# Patient Record
Sex: Male | Born: 1990 | Hispanic: No | Marital: Single | Smoking: Never smoker
Health system: Southern US, Community
[De-identification: ages and names within clinical notes are randomized; demographics above are authoritative.]

## PROBLEM LIST (undated history)

## (undated) DIAGNOSIS — F338 Other recurrent depressive disorders: Secondary | ICD-10-CM

---

## 2012-04-05 ENCOUNTER — Emergency Department: Payer: Self-pay | Admitting: Emergency Medicine

## 2012-04-24 ENCOUNTER — Emergency Department: Payer: Self-pay | Admitting: Emergency Medicine

## 2012-04-24 LAB — URINALYSIS, COMPLETE
Bacteria: NONE SEEN
Bilirubin,UR: NEGATIVE
Blood: NEGATIVE
Glucose,UR: NEGATIVE mg/dL (ref 0–75)
Protein: NEGATIVE
RBC,UR: NONE SEEN /HPF (ref 0–5)
Specific Gravity: 1.008 (ref 1.003–1.030)
Squamous Epithelial: NONE SEEN
WBC UR: <1 /HPF (ref 0–5)

## 2012-04-24 LAB — DRUG SCREEN, URINE
Barbiturates, Ur Screen: NEGATIVE (ref ?–200)
Cannabinoid 50 Ng, Ur ~~LOC~~: NEGATIVE (ref ?–50)
Cocaine Metabolite,Ur ~~LOC~~: NEGATIVE (ref ?–300)
MDMA (Ecstasy)Ur Screen: NEGATIVE (ref ?–500)
Opiate, Ur Screen: NEGATIVE (ref ?–300)
Phencyclidine (PCP) Ur S: NEGATIVE (ref ?–25)

## 2012-04-24 LAB — CBC
HCT: 47 % (ref 40.0–52.0)
Platelet: 334 10*3/uL (ref 150–440)
RBC: 5.21 10*6/uL (ref 4.40–5.90)
RDW: 12.4 % (ref 11.5–14.5)
WBC: 14.9 10*3/uL — ABNORMAL HIGH (ref 3.8–10.6)

## 2012-04-24 LAB — ETHANOL: Ethanol: <3 mg/dL

## 2012-04-24 LAB — COMPREHENSIVE METABOLIC PANEL
Alkaline Phosphatase: 83 U/L (ref 50–136)
Anion Gap: 11 (ref 7–16)
Creatinine: 1.16 mg/dL (ref 0.60–1.30)
EGFR (African American): >60
Glucose: 113 mg/dL — ABNORMAL HIGH (ref 65–99)
Osmolality: 274 (ref 275–301)
SGPT (ALT): 23 U/L
Sodium: 136 mmol/L (ref 136–145)

## 2012-04-24 LAB — CK: CK, Total: 162 U/L (ref 35–232)

## 2012-04-24 LAB — TSH: Thyroid Stimulating Horm: 4.05 u[IU]/mL

## 2012-12-07 ENCOUNTER — Ambulatory Visit: Payer: Self-pay | Admitting: Psychiatry

## 2012-12-07 LAB — COMPREHENSIVE METABOLIC PANEL
Alkaline Phosphatase: 69 U/L (ref 50–136)
Anion Gap: 3 — ABNORMAL LOW (ref 7–16)
BUN: 15 mg/dL (ref 7–18)
Bilirubin,Total: 0.5 mg/dL (ref 0.2–1.0)
Calcium, Total: 9.1 mg/dL (ref 8.5–10.1)
Chloride: 108 mmol/L — ABNORMAL HIGH (ref 98–107)
EGFR (African American): >60
EGFR (Non-African Amer.): >60
Osmolality: 280 (ref 275–301)
Potassium: 4.5 mmol/L (ref 3.5–5.1)
SGOT(AST): 28 U/L (ref 15–37)
Sodium: 140 mmol/L (ref 136–145)
Total Protein: 7.9 g/dL (ref 6.4–8.2)

## 2012-12-07 LAB — LIPID PANEL
HDL Cholesterol: 56 mg/dL (ref 40–60)
Ldl Cholesterol, Calc: 119 mg/dL — ABNORMAL HIGH (ref 0–100)
VLDL Cholesterol, Calc: 17 mg/dL (ref 5–40)

## 2012-12-07 LAB — TSH: Thyroid Stimulating Horm: 1.97 u[IU]/mL

## 2012-12-16 ENCOUNTER — Ambulatory Visit: Payer: Self-pay | Admitting: Psychiatry
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
## 2018-11-25 ENCOUNTER — Encounter: Payer: Self-pay | Admitting: Emergency Medicine

## 2018-11-25 ENCOUNTER — Other Ambulatory Visit: Payer: Self-pay

## 2018-11-25 ENCOUNTER — Emergency Department
Admission: EM | Admit: 2018-11-25 | Discharge: 2018-11-25 | Disposition: A | Discharge status: Other Acute Inpatient Hospital | Payer: Self-pay | Attending: Emergency Medicine | Admitting: Emergency Medicine

## 2018-11-25 ENCOUNTER — Inpatient Hospital Stay
Admission: AD | Admit: 2018-11-25 | Discharge: 2018-12-02 | DRG: 885 | Disposition: A | Discharge status: Home / Self Care | Payer: No Typology Code available for payment source | Source: Intra-hospital | Attending: Psychiatry | Admitting: Psychiatry

## 2018-11-25 DIAGNOSIS — Y929 Unspecified place or not applicable: Secondary | ICD-10-CM | POA: Insufficient documentation

## 2018-11-25 DIAGNOSIS — E559 Vitamin D deficiency, unspecified: Secondary | ICD-10-CM | POA: Diagnosis present

## 2018-11-25 DIAGNOSIS — F419 Anxiety disorder, unspecified: Secondary | ICD-10-CM | POA: Diagnosis present

## 2018-11-25 DIAGNOSIS — F329 Major depressive disorder, single episode, unspecified: Secondary | ICD-10-CM | POA: Diagnosis present

## 2018-11-25 DIAGNOSIS — F39 Unspecified mood [affective] disorder: Secondary | ICD-10-CM | POA: Diagnosis present

## 2018-11-25 DIAGNOSIS — R030 Elevated blood-pressure reading, without diagnosis of hypertension: Secondary | ICD-10-CM | POA: Diagnosis not present

## 2018-11-25 DIAGNOSIS — F203 Undifferentiated schizophrenia: Secondary | ICD-10-CM

## 2018-11-25 DIAGNOSIS — F3132 Bipolar disorder, current episode depressed, moderate: Secondary | ICD-10-CM

## 2018-11-25 DIAGNOSIS — Z915 Personal history of self-harm: Secondary | ICD-10-CM | POA: Diagnosis not present

## 2018-11-25 DIAGNOSIS — F209 Schizophrenia, unspecified: Secondary | ICD-10-CM | POA: Diagnosis present

## 2018-11-25 DIAGNOSIS — R946 Abnormal results of thyroid function studies: Secondary | ICD-10-CM | POA: Diagnosis present

## 2018-11-25 DIAGNOSIS — Z91199 Patient's noncompliance with other medical treatment and regimen due to unspecified reason: Secondary | ICD-10-CM

## 2018-11-25 DIAGNOSIS — IMO0002 Reserved for concepts with insufficient information to code with codable children: Secondary | ICD-10-CM

## 2018-11-25 DIAGNOSIS — Z7289 Other problems related to lifestyle: Secondary | ICD-10-CM

## 2018-11-25 DIAGNOSIS — Z888 Allergy status to other drugs, medicaments and biological substances status: Secondary | ICD-10-CM

## 2018-11-25 DIAGNOSIS — X838XXA Intentional self-harm by other specified means, initial encounter: Secondary | ICD-10-CM | POA: Insufficient documentation

## 2018-11-25 DIAGNOSIS — Z9119 Patient's noncompliance with other medical treatment and regimen: Secondary | ICD-10-CM

## 2018-11-25 DIAGNOSIS — Y999 Unspecified external cause status: Secondary | ICD-10-CM | POA: Insufficient documentation

## 2018-11-25 DIAGNOSIS — Z9114 Patient's other noncompliance with medication regimen: Secondary | ICD-10-CM | POA: Diagnosis not present

## 2018-11-25 DIAGNOSIS — Y939 Activity, unspecified: Secondary | ICD-10-CM | POA: Insufficient documentation

## 2018-11-25 HISTORY — DX: Other recurrent depressive disorders: F33.8

## 2018-11-25 LAB — COMPREHENSIVE METABOLIC PANEL
ALBUMIN: 4.6 g/dL (ref 3.5–5.0)
ALK PHOS: 53 U/L (ref 38–126)
ALT: 15 U/L (ref 0–44)
AST: 23 U/L (ref 15–41)
Anion gap: 9 (ref 5–15)
BUN: 16 mg/dL (ref 6–20)
CALCIUM: 9.6 mg/dL (ref 8.9–10.3)
CHLORIDE: 102 mmol/L (ref 98–111)
CO2: 27 mmol/L (ref 22–32)
CREATININE: 0.89 mg/dL (ref 0.61–1.24)
GFR calc Af Amer: >60 mL/min (ref >60)
GFR calc non Af Amer: >60 mL/min (ref >60)
GLUCOSE: 109 mg/dL — AB (ref 70–99)
Potassium: 4.2 mmol/L (ref 3.5–5.1)
SODIUM: 138 mmol/L (ref 135–145)
Total Bilirubin: 0.7 mg/dL (ref 0.3–1.2)
Total Protein: 8.6 g/dL — ABNORMAL HIGH (ref 6.5–8.1)

## 2018-11-25 LAB — CBC
HEMATOCRIT: 45.3 % (ref 39.0–52.0)
HEMOGLOBIN: 15.1 g/dL (ref 13.0–17.0)
MCH: 29.3 pg (ref 26.0–34.0)
MCHC: 33.3 g/dL (ref 30.0–36.0)
MCV: 87.8 fL (ref 80.0–100.0)
Platelets: 293 10*3/uL (ref 150–400)
RBC: 5.16 MIL/uL (ref 4.22–5.81)
RDW: 11.9 % (ref 11.5–15.5)
WBC: 10.8 10*3/uL — AB (ref 4.0–10.5)
nRBC: 0 % (ref 0.0–0.2)

## 2018-11-25 LAB — ACETAMINOPHEN LEVEL

## 2018-11-25 LAB — ETHANOL: Alcohol, Ethyl (B): <10 mg/dL (ref <10)

## 2018-11-25 LAB — SALICYLATE LEVEL: Salicylate Lvl: <7 mg/dL (ref 2.8–30.0)

## 2018-11-25 MED ORDER — HYDROXYZINE HCL 25 MG PO TABS
25.0000 mg | ORAL_TABLET | Freq: Three times a day (TID) | ORAL | Status: DC | PRN
  Administered 2018-11-26: 25 mg via ORAL
  Filled 2018-11-25: qty 1

## 2018-11-25 MED ORDER — ACETAMINOPHEN 325 MG PO TABS: 650.0000 mg | ORAL_TABLET | Freq: Four times a day (QID) | ORAL | Status: DC | PRN

## 2018-11-25 MED ORDER — MAGNESIUM HYDROXIDE 400 MG/5ML PO SUSP: 30.0000 mL | Freq: Every day | ORAL | Status: DC | PRN

## 2018-11-25 MED ORDER — TRAZODONE HCL 100 MG PO TABS
100.0000 mg | ORAL_TABLET | Freq: Every evening | ORAL | Status: DC | PRN
  Administered 2018-11-25 – 2018-11-30 (×5): 100 mg via ORAL
  Filled 2018-11-25 (×6): qty 1

## 2018-11-25 MED ORDER — OLANZAPINE 10 MG PO TABS
10.0000 mg | ORAL_TABLET | Freq: Every day | ORAL | Status: DC
  Administered 2018-11-25 – 2018-12-01 (×7): 10 mg via ORAL
  Filled 2018-11-25 (×7): qty 1

## 2018-11-25 MED ORDER — DOUBLE ANTIBIOTIC 500-10000 UNIT/GM EX OINT
TOPICAL_OINTMENT | Freq: Two times a day (BID) | CUTANEOUS | Status: AC
  Administered 2018-11-25: 22:00:00 via TOPICAL
  Administered 2018-11-26 (×2): 1 via TOPICAL
  Filled 2018-11-25: qty 28.4

## 2018-11-25 MED ORDER — LORAZEPAM 2 MG/ML IJ SOLN: 0.5000 mg | Freq: Four times a day (QID) | INTRAMUSCULAR | Status: DC | PRN

## 2018-11-25 MED ORDER — DOUBLE ANTIBIOTIC 500-10000 UNIT/GM EX OINT
TOPICAL_OINTMENT | Freq: Two times a day (BID) | CUTANEOUS | Status: DC
  Administered 2018-11-25: 16:00:00 via TOPICAL
  Filled 2018-11-25: qty 28.4

## 2018-11-25 MED ORDER — OLANZAPINE 10 MG PO TABS: 10.0000 mg | ORAL_TABLET | Freq: Every day | ORAL | Status: DC

## 2018-11-25 MED ORDER — ALUM & MAG HYDROXIDE-SIMETH 200-200-20 MG/5ML PO SUSP: 30.0000 mL | ORAL | Status: DC | PRN

## 2018-11-25 NOTE — Tx Team (Signed)
Initial Treatment Plan 11/25/2018 5:42 PM [name] [id number]    PATIENT STRESSORS: Financial difficulties Medication change or noncompliance   PATIENT STRENGTHS: Ability for insight Average or above average intelligence Capable of independent living Communication skills   PATIENT IDENTIFIED PROBLEMS: DEPRESSION 11/25/18  Suicidal  11/25/18                   DISCHARGE CRITERIA:  Ability to meet basic life and health needs Improved stabilization in mood, thinking, and/or behavior  PRELIMINARY DISCHARGE PLAN: Outpatient therapy Return to previous living arrangement  PATIENT/FAMILY INVOLVEMENT: This treatment plan has been presented to and reviewed with the patient, [name], and/or family member,  The patient and family have been given the opportunity to ask questions and make suggestions.  Crist InfanteGwen A Umair Rosiles, RN 11/25/2018, 5:42 PM

## 2018-11-25 NOTE — ED Notes (Signed)
Nurse received report from Roger B. In the quad, patient transferred to room 4 in the BHU, patient is quiet and cooperative at this time.
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
## 2018-11-25 NOTE — BH Assessment (Signed)
Patient is to be [facility] BM[facility] by Dr. Toni Amendlapacs.  Attending Physician will be Dr.Pucilowska.   Patient has been assigned to room 320, by [facility] Charge Nurse Gwen.   Intake Paper Work has been signed and placed on patient chart.  ER staff is aware of the admission:  Nitchaa, ER Secretary    Dr. Lenard LancePaduchowski, ER MD   Toniann FailWendy, Patient's Nurse   Ethelene BrownsAnthony, Patient Access.

## 2018-11-25 NOTE — ED Notes (Signed)
Patient with discharge/readmit orders to go to BMU, Patient voices understanding.

## 2018-11-25 NOTE — ED Notes (Signed)
Patient resting quietly in room. No noted distress or abnormal behaviors noted. Will continue 15 minute checks and observation by security for safety. 
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
## 2018-11-25 NOTE — BH Assessment (Signed)
Assessment Note  [name] is an 27 y.o. male. [name] arrived to the ED by law enforcement under IVC.  He reports "I was taking off my glasses, and I accidentally scratched my neck. I came home and I saw the scar. My mom wanted me to come to the hospital and have it checked out.  He states that Dr. Octavia Heir had recommended some vitamin supplements and he wondered if there were other supplements that he could take. He states that the vitamins are for mood stabilization. He reports that he has seasonal depression.  He states that he does not go out much, easily frustrated, less goal oriented, and less purpose.  He denied symptoms of anxiety.  He denied having auditory or visual hallucinations.  He denied suicidal ideation or intent.  He denied homicidal ideation or intent.  He reports that he is going to be alone in the house soon.  He denied use of alcohol or drugs for 2-3 months.   IVC paperwork reports, "Law enforcement was contracted and responded to the listed address regarding a person with mental issues that had hurt himself. [name] met with the respondent. Who is being treated for mental health issues and states that he had stabbed himself in the neck because he was upset with his work hours. The respondent stated the pain of hurting himself makes him feel better. Because of this latest event of hurting himself the respondent was placed in custody and taken to the hospital for treatment of his injury and it is felt a mental health evaluation should be completed as it is feared the respondent will continue to harm himself or possible others that may try to stop him.  Diagnosis: Depression  Past Medical History:  Past Medical History:  Diagnosis Date  . Seasonal affective disorder ([place])     History reviewed. No pertinent surgical history.  Family History: History reviewed. No pertinent family history.  Social History:  reports that he has never smoked. He has never used smokeless  tobacco. He reports that he does not drink alcohol or use drugs.  Additional Social History:  Alcohol / Drug Use History of alcohol / drug use?: (Reports no substance use for 2-3 months)  CIWA: CIWA-Ar BP: (!) 142/79 Pulse Rate: 90 COWS:    Allergies:  Allergies  Allergen Reactions  . Haldol [Haloperidol Lactate] Other (See Comments)    Involuntary movements  . Other     Pt says he has sensitive skin-gets bacterial infections easily; has to be careful with laundry detergent and dishwasher soap    Home Medications:  (Not in a hospital admission)  OB/GYN Status:  No LMP for male patient.  General Assessment Data Location of Assessment: [facility] ED TTS Assessment: In system Is this a Tele or Face-to-Face Assessment?: Face-to-Face Is this an Initial Assessment or a Re-assessment for this encounter?: Initial Assessment Patient Accompanied by:: N/A Language Other than English: No Living Arrangements: Other (Comment)(Private residence) Marital status: Single Pregnancy Status: No Living Arrangements: Parent Can pt return to current living arrangement?: Yes Admission Status: Involuntary Petitioner: Police Is patient capable of signing voluntary admission?: No Referral Source: Self/Family/Friend Insurance type: Unsure  Medical Screening Exam ([place]) Medical Exam completed: Yes  Crisis Care Plan Living Arrangements: Parent Legal Guardian: Other:(Self) Name of Psychiatrist: Dr. Octavia Heir Name of Therapist: [facility]  Education Status Is patient currently in school?: No Is the patient employed, unemployed or receiving disability?: Employed(Part time)  Risk to self with the past 6 months  Suicidal Ideation: No(patient denied) Has patient been a risk to self within the past 6 months prior to admission? : No Suicidal Intent: No Has patient had any suicidal intent within the past 6 months prior to admission? : No Is patient at risk for suicide?: No Suicidal Plan?: No Has  patient had any suicidal plan within the past 6 months prior to admission? : No Access to Means: No What has been your use of drugs/alcohol within the last 12 months?: Has not used drugs in the past 2-3 months Previous Attempts/Gestures: No How many times?: 0 Other Self Harm Risks: denied Triggers for Past Attempts: None known Intentional Self Injurious Behavior: None Family Suicide History: No Recent stressful life event(s): Other (Comment)(family pressure) Persecutory voices/beliefs?: No Depression: Yes Depression Symptoms: Despondent Substance abuse history and/or treatment for substance abuse?: Yes Suicide prevention information given to non-admitted patients: Not applicable  Risk to Others within the past 6 months Homicidal Ideation: No Does patient have any lifetime risk of violence toward others beyond the six months prior to admission? : No Thoughts of Harm to Others: No Current Homicidal Intent: No Current Homicidal Plan: No Access to Homicidal Means: No Identified Victim: No one identified History of harm to others?: No Assessment of Violence: None Noted Violent Behavior Description: denied Does patient have access to weapons?: No Criminal Charges Pending?: No Does patient have a court date: No Is patient on probation?: No  Psychosis Hallucinations: None noted Delusions: None noted  Mental Status Report Appearance/Hygiene: In scrubs Eye Contact: Poor(Staring straight ahead) Motor Activity: Unremarkable Speech: Slow, Soft Level of Consciousness: Alert Mood: Depressed Affect: Flat Anxiety Level: None Thought Processes: Tangential Judgement: Partial Orientation: Appropriate for developmental age Obsessive Compulsive Thoughts/Behaviors: None  Cognitive Functioning Concentration: Fair Memory: Recent Intact Is patient IDD: No Insight: Fair Impulse Control: Fair Appetite: Fair Have you had any weight changes? : No Change Sleep: Increased Vegetative  Symptoms: None  ADLScreening [facility] Assessment Services) Patient's cognitive ability adequate to safely complete daily activities?: Yes Patient able to express need for assistance with ADLs?: Yes Independently performs ADLs?: Yes (appropriate for developmental age)  Prior Inpatient Therapy Prior Inpatient Therapy: No  Prior Outpatient Therapy Prior Outpatient Therapy: Yes Prior Therapy Dates: Current Prior Therapy Facilty/Provider(s): [facility] Reason for Treatment: Depression, Substance abuse Does patient have an ACCT team?: No Does patient have Intensive In-House Services?  : No Does patient have [facility] services? : No Does patient have P4CC services?: No  ADL Screening (condition at time of admission) Patient's cognitive ability adequate to safely complete daily activities?: Yes Is the patient deaf or have difficulty hearing?: No Does the patient have difficulty seeing, even when wearing glasses/contacts?: No Does the patient have difficulty concentrating, remembering, or making decisions?: No Patient able to express need for assistance with ADLs?: Yes Does the patient have difficulty dressing or bathing?: No Independently performs ADLs?: Yes (appropriate for developmental age) Does the patient have difficulty walking or climbing stairs?: No Weakness of Legs: None Weakness of Arms/Hands: None  Home Assistive Devices/Equipment Home Assistive Devices/Equipment: None    Abuse/Neglect Assessment (Assessment to be complete while patient is alone) Abuse/Neglect Assessment Can Be Completed: (denied a history of abuse)     Advance Directives (For Healthcare) Does Patient Have a Medical Advance Directive?: No Would patient like information on creating a medical advance directive?: No - Patient declined          Disposition:  Disposition Initial Assessment Completed for this Encounter: Yes  On Site Evaluation  by:   Reviewed with Physician:    Elmer Bales 11/25/2018 3:45  AM

## 2018-11-25 NOTE — Consult Note (Signed)
[facility] Face-to-Face Psychiatry Consult   Reason for Consult: Consult for this 27 year old man brought into the hospital because of a superficial cut to his neck Referring Physician: Paduchowski Patient Identification: [name] MRN:  [id number] Principal Diagnosis: Schizophrenia (HCC) Diagnosis:  Principal Problem:   Schizophrenia (HCC) Active Problems:   Noncompliance   Self-inflicted injury   Total Time spent with patient: 1 hour  Subjective:   [name] is a 27 y.o. male patient admitted with "they did not give me enough hours so I was frustrated".  HPI: Patient seen chart reviewed.  Also spoke with the patient's mother by telephone.  75 year old man with a history of schizophrenia.  Patient was on his way home from work apparently yesterday and had found out that he was only given 1 day of work next week.  He was frustrated that he is not getting more hours.  He says he cut himself on the neck.  He had changed his story at one point to say that it was unintentional but admits now that he used the sharp end of a pair of eyeglasses to cut himself on the neck.  Extremely superficial cut does not look like there was any intention to kill himself but he did feel frustrated and feel like hurting himself.  Patient admits his mood is been anxious down negative and worried for quite a while now.  He is somewhat vague and disorganized in his thinking and has a hard time putting an exact date on it.  Sleep sounds like it is erratic depending on his work schedule.  Has almost no activity outside of working when he is staying at home with his family.  Patient denies any hallucinations but does seem scattered and disorganized in his thinking.  Mother reports that the patient has been showing clear psychotic symptoms recently.  Reports that he seems disorganized withdrawn and is not taking care of himself.  He has not been following up with outpatient psychiatric care probably for years.  He  denies any recent alcohol or drug use.  Social history: Lives at home with his parents.  Patient actually was in college and doing very well when he developed a psychotic disorder.  Family and patient still mourning his loss of opportunity.  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  Medical history: This cut on his neck is extremely superficial scratch but there is no harm in putting some antibiotic ointment on it.  Substance abuse history: Denies alcohol or drug use no significant past issues as far as I can see.  Past Psychiatric History: Patient has a history of schizophrenia.  He has had prior hospitalizations and been treated with a variety of medicines including both antidepressants and antipsychotics.  He did well by family report when he was taking Zyprexa and managed to stay stable for about a year but then again became noncompliant.  He has repeatedly been noncompliant with long-term treatment.  Does not have a history of violence.  Some history of self injury in the past.  Risk to Self: Suicidal Ideation: No(patient denied) Suicidal Intent: No Is patient at risk for suicide?: No Suicidal Plan?: No Access to Means: No What has been your use of drugs/alcohol within the last 12 months?: Has not used drugs in the past 2-3 months How many times?: 0 Other Self Harm Risks: denied Triggers for Past Attempts: None known Intentional Self Injurious Behavior: None Risk to  Others: Homicidal Ideation: No Thoughts of Harm to Others: No Current Homicidal Intent: No Current Homicidal Plan: No Access to Homicidal Means: No Identified Victim: No one identified History of harm to others?: No Assessment of Violence: None Noted Violent Behavior Description: denied Does patient have access to weapons?: No Criminal Charges Pending?: No Does patient have a court date: No Prior Inpatient Therapy: Prior Inpatient Therapy:  No Prior Outpatient Therapy: Prior Outpatient Therapy: Yes Prior Therapy Dates: Current Prior Therapy Facilty/Provider(s): [facility] Reason for Treatment: Depression, Substance abuse Does patient have an ACCT team?: No Does patient have Intensive In-House Services?  : No Does patient have [facility] services? : No Does patient have P4CC services?: No  Past Medical History:  Past Medical History:  Diagnosis Date  . Seasonal affective disorder (HCC)    History reviewed. No pertinent surgical history. Family History: History reviewed. No pertinent family history. Family Psychiatric  History: None reported Social History:  Social History   Substance and Sexual Activity  Alcohol Use Never  . Frequency: Never     Social History   Substance and Sexual Activity  Drug Use Never    Social History   Socioeconomic History  . Marital status: Single    Spouse name: Not on file  . Number of children: Not on file  . Years of education: Not on file  . Highest education level: Not on file  Occupational History  . Not on file  Social Needs  . Financial resource strain: Not on file  . Food insecurity:    Worry: Not on file    Inability: Not on file  . Transportation needs:    Medical: Not on file    Non-medical: Not on file  Tobacco Use  . Smoking status: Never Smoker  . Smokeless tobacco: Never Used  Substance and Sexual Activity  . Alcohol use: Never    Frequency: Never  . Drug use: Never  . Sexual activity: Not on file  Lifestyle  . Physical activity:    Days per week: Not on file    Minutes per session: Not on file  . Stress: Not on file  Relationships  . Social connections:    Talks on phone: Not on file    Gets together: Not on file    Attends religious service: Not on file    Active member of club or organization: Not on file    Attends meetings of clubs or organizations: Not on file    Relationship status: Not on file  Other Topics Concern  . Not on file  Social  History Narrative  . Not on file   Additional Social History:    Allergies:   Allergies  Allergen Reactions  . Haldol [Haloperidol Lactate] Other (See Comments)    Involuntary movements  . Other     Pt says he has sensitive skin-gets bacterial infections easily; has to be careful with laundry detergent and dishwasher soap    Labs:  Results for orders placed or performed during the hospital encounter of 11/25/18 (from the past 48 hour(s))  Comprehensive metabolic panel     Status: Abnormal   Collection Time: 11/25/18 12:50 AM  Result Value Ref Range   Sodium 138 135 - 145 mmol/L   Potassium 4.2 3.5 - 5.1 mmol/L   Chloride 102 98 - 111 mmol/L   CO2 27 22 - 32 mmol/L   Glucose, Bld 109 (H) 70 - 99 mg/dL   BUN 16 6 - 20 mg/dL  Creatinine, Ser 0.89 0.61 - 1.24 mg/dL   Calcium 9.6 8.9 - 40.910.3 mg/dL   Total Protein 8.6 (H) 6.5 - 8.1 g/dL   Albumin 4.6 3.5 - 5.0 g/dL   AST 23 15 - 41 U/L   ALT 15 0 - 44 U/L   Alkaline Phosphatase 53 38 - 126 U/L   Total Bilirubin 0.7 0.3 - 1.2 mg/dL   GFR calc non Af Amer >60 >60 mL/min   GFR calc Af Amer >60 >60 mL/min   Anion gap 9 5 - 15    Comment: Performed at [facility], [address] [place], [place] [address]  Ethanol     Status: None   Collection Time: 11/25/18 12:50 AM  Result Value Ref Range   Alcohol, Ethyl (B) <10 <10 mg/dL    Comment: (NOTE) Lowest detectable limit for serum alcohol is 10 mg/dL. For medical purposes only. Performed at [facility], [address] [place], [place] [address]   cbc     Status: Abnormal   Collection Time: 11/25/18 12:50 AM  Result Value Ref Range   WBC 10.8 (H) 4.0 - 10.5 K/uL   RBC 5.16 4.22 - 5.81 MIL/uL   Hemoglobin 15.1 13.0 - 17.0 g/dL   HCT 56.245.3 13.039.0 - 86.552.0 %   MCV 87.8 80.0 - 100.0 fL   MCH 29.3 26.0 - 34.0 pg   MCHC 33.3 30.0 - 36.0 g/dL   RDW 78.411.9 69.611.5 - 29.515.5 %   Platelets 293 150 - 400 K/uL   nRBC 0.0 0.0 - 0.2 %    Comment: Performed at [facility], [address] [place], [place] [address]  Acetaminophen level     Status: Abnormal   Collection Time: 11/25/18 12:50 AM  Result Value Ref Range   Acetaminophen (Tylenol), Serum <10 (L) 10 - 30 ug/mL    Comment: (NOTE) Therapeutic concentrations vary significantly. A range of 10-30 ug/mL  may be an effective concentration for many patients. However, some  are best treated at concentrations outside of this range. Acetaminophen concentrations >150 ug/mL at 4 hours after ingestion  and >50 ug/mL at 12 hours after ingestion are often associated with  toxic reactions. Performed at [facility], [address] [place], [place] [address]   Salicylate level     Status: None   Collection Time: 11/25/18 12:50 AM  Result Value Ref Range   Salicylate Lvl <7.0 2.8 - 30.0 mg/dL    Comment: Performed at [facility], [address] [place], [place] [address]    Current Facility-Administered Medications  Medication Dose Route Frequency Provider Last Rate Last Dose  . LORazepam (ATIVAN) injection 0.5 mg  0.5 mg Intramuscular Q6H PRN Irean HongSung, Jade J, MD      . OLANZapine [facility](ZYPREXA) tablet 10 mg  10 mg Oral QHS Heidi Maclin, Jackquline DenmarkJohn T, MD      . polymixin-bacitracin (POLYSPORIN) ointment   Topical BID Jash Wahlen, Jackquline DenmarkJohn T, MD       No current outpatient medications on file.    Musculoskeletal: Strength & Muscle Tone: within normal limits Gait & Station: normal Patient leans: N/A  Psychiatric Specialty Exam: Physical Exam  Nursing note and vitals reviewed. Constitutional: He appears well-developed and well-nourished.  HENT:  Head: Normocephalic and atraumatic.  Eyes: Pupils are equal, round, and reactive to light. Conjunctivae are normal.  Neck: Normal range of motion.  Cardiovascular: Regular rhythm and normal heart sounds.  Respiratory: Effort normal. No respiratory distress.  GI: Soft.  Musculoskeletal: Normal range of motion.  Neurological: He is alert.  Skin:  Skin is warm and dry.  Psychiatric: His affect is blunt. His speech is delayed and tangential. He is slowed and withdrawn. Thought content is paranoid. Cognition and memory are impaired. He expresses impulsivity and inappropriate judgment. He expresses no homicidal and no suicidal ideation. He is inattentive.    Review of Systems  Constitutional: Negative.   HENT: Negative.   Eyes: Negative.   Respiratory: Negative.   Cardiovascular: Negative.   Gastrointestinal: Negative.   Musculoskeletal: Negative.   Skin: Negative.   Neurological: Negative.   Psychiatric/Behavioral: Positive for depression and memory loss. Negative for hallucinations, substance abuse and suicidal ideas. The patient is nervous/anxious and has insomnia.     Blood pressure (!) 142/79, pulse 90, temperature 99.1 F (37.3 C), temperature source Oral, resp. rate 18, height 5\' 11"  (1.803 m), weight 72.6 kg, SpO2 100 %.Body mass index is 22.32 kg/m.  General Appearance: Fairly Groomed  Eye Contact:  Minimal  Speech:  Slow  Volume:  Decreased  Mood:  Dysphoric  Affect:  Constricted  Thought Process:  Disorganized  Orientation:  Full (Time, Place, and Person)  Thought Content:  Illogical, Rumination and Tangential  Suicidal Thoughts:  No  Homicidal Thoughts:  No  Memory:  Immediate;   Fair Recent;   Poor Remote;   Fair  Judgement:  Impaired  Insight:  Shallow  Psychomotor Activity:  Decreased  Concentration:  Concentration: Poor  Recall:  Poor  Fund of Knowledge:  Fair  Language:  Fair  Akathisia:  No  Handed:  Right  AIMS (if indicated):     Assets:  Desire for Improvement Housing Physical Health Resilience  ADL's:  Impaired  Cognition:  Impaired,  Mild  Sleep:        Treatment Plan Summary: Daily contact with patient to assess and evaluate symptoms and progress in treatment, Medication management and Plan Patient with schizophrenia presents after superficially cutting himself on the neck.  Patient  presents with disorganized thinking poor functioning out the hospital poor self-care.  No active psychiatric treatment.  Represents high risk of ongoing dangerousness or inability to function.  Patient is also at least tentatively agreeable to being in the hospital.  I will go ahead and continue the IVC and put in orders to admit him to the psychiatric ward.  Full set of labs will be done.  I have ordered Zyprexa oral to start again tonight.  Family understands the plan and the patient is agreeable although we will continue the IV Cipro for now.  Disposition: Recommend psychiatric Inpatient admission when medically cleared. Supportive therapy provided about ongoing stressors.  Mordecai Rasmussen, MD 11/25/2018 2:44 PM

## 2018-11-25 NOTE — ED Notes (Signed)
Patient is talking to S.O.C.

## 2018-11-25 NOTE — ED Provider Notes (Signed)
-----------------------------------------   3:38 PM on 11/25/2018 -----------------------------------------  Patient seen by psychiatry they will be admitting to their service once a bed becomes available.   Minna AntisPaduchowski, Decorian Schuenemann, MD 11/25/18 1538

## 2018-11-25 NOTE — Plan of Care (Signed)
  Problem: Education: Goal: Knowledge of [facility] Education information/materials will improve Outcome: Progressing  Patient instructed on [facility]  Education and unit programing . Able to verbalize understanding  

## 2018-11-25 NOTE — ED Notes (Signed)
Gave food tray with juice. 

## 2018-11-25 NOTE — ED Notes (Signed)
Patient's belongings taken with him, he was escorted per police BMU.

## 2018-11-25 NOTE — Progress Notes (Signed)
Admission Note:  Report from Toniann FailWendy RN In the ER  D: Pt appeared depressed  With  a flat affect.  Pt  denies SI / AVH at this time. Patient stated he was driving and some how cut his throat with his glasses . Patient sees  Dr. Mare FerrariLavine  And is on some mood stabilizers , which he calls vitamin supplements  Patient denies alcohol, smoking  and drug  Use . Patient voice no concerns around his work  But had  been upset about  His  Hours  He received.  Reported he stabed his self in neck mother saw it and wanted help for her son . Patient added he felt good  When he inflicted pain .  Pt is redirectable and cooperative with assessment.      A: Pt admitted to unit per protocol, skin assessment and search done and no contraband found.  Pt  educated on therapeutic milieu rules. Pt was introduced to milieu by nursing staff.    R: Pt was receptive to education about the milieu .  15 min safety checks started. Clinical research associatewriter offered support

## 2018-11-25 NOTE — ED Notes (Signed)
Report given to Wendy, RN.

## 2018-11-25 NOTE — ED Triage Notes (Signed)
Pt arrived in handcuffs with [employer] officer [place]; IVC papers in route; pt's mother called police after her son arrived home with a cut on his neck; she believed it to be self inflicted and pt was a danger to himself and to her; pt says he did cause the mark on his neck with plastic glasses he was wearing; pt says he was just frustrated because he was not scheduled for the hours he wanted next week at his job; pt with superficial abrasion horizontally across the right side of his neck; pt denies suicidal thoughts; flat affect noted;

## 2018-11-26 DIAGNOSIS — F203 Undifferentiated schizophrenia: Secondary | ICD-10-CM

## 2018-11-26 LAB — HEMOGLOBIN A1C
Hgb A1c MFr Bld: 5.3 % (ref 4.8–5.6)
Mean Plasma Glucose: 105.41 mg/dL

## 2018-11-26 LAB — LIPID PANEL
Cholesterol: 193 mg/dL (ref 0–200)
HDL: 48 mg/dL (ref >40)
LDL Cholesterol: 132 mg/dL — ABNORMAL HIGH (ref 0–99)
Total CHOL/HDL Ratio: 4 RATIO
Triglycerides: 63 mg/dL (ref <150)
VLDL: 13 mg/dL (ref 0–40)

## 2018-11-26 LAB — TSH: TSH: 5.2 u[IU]/mL — ABNORMAL HIGH (ref 0.350–4.500)

## 2018-11-26 NOTE — Progress Notes (Signed)
Recreation Therapy Notes  Date: 11/26/2018  Time: 9:30 am  Location: Craft Room  Behavioral response: Appropriate, Isolated  Intervention Topic: Happiness  Discussion/Intervention:  Group content today was focused on Happiness. The group defined happiness and stated reasons they are and are not happy at times. Participants identified reasons they are normally happy and why. Individuals expressed how not being happy affects themselves and others. Patients stated reasons why happiness is important to them. The group described how they feel when they are happy. Individuals participated in the intervention "What is happiness" where they defined what happiness means to them.  Clinical Observations/Feedback:  Patient came to group and was not engaged in anyway during the discussion. He participated in the activity but remained isolated during the entire group. Trevious Rampey LRT/CTRS          Olga Seyler 11/26/2018 1:19 PM

## 2018-11-26 NOTE — H&P (Addendum)
Psychiatric Admission Assessment Adult  Patient Identification: [name] MRN:  [id number] Date of Evaluation:  11/26/2018 Chief Complaint:  Schizophrenia-"I didn't get the hours I expected." Principal Diagnosis: Schizophrenia (HCC) Diagnosis:  Principal Problem:   Schizophrenia (HCC)  History of Present Illness: [name] is a 27 yo male with schizophrenia and history of medication noncompliance who was admitted after scratching his neck superficially with the end of his glasses.  Pt states the reason he scratched himself was b/c he was feeling frustrated about not receiving more hours at his current job.  Pt denies that it was a suicide attempt.  Pt works in the [employer] at [employer].  Pt states that working keeps his mind occupied and allows him to be around peers his age.  When he's not working, he's playing video games.  Pt states he feels isolated at times and bored.  Pt admits to hx of anger outbursts leading to self-injurious behaviors.  For example 1 month ago, pt became upset over something and used a piece of wood to nick his right pointer finger.  Pt shows me the small scar on his finger.  Pt has been observed by staff on the unit talking to self and having a conversation with no one there.  However, pt denies AH, VH, SI, HI.  Pt provides verbal and written consent for me to speak to his mother, Lanier Clam [phone]. She states that the pt was diagnosed with schizophrenia in ~2013.  He did well on Zyprexa for a few months and then pt stopped taking it on his own.  Mom states during the time the pt was compliant with his medication, he was able work and keep his emotions under control.  Off his medication, mother has heard the pt talking to himself or having conversations with others that are not there; she states he is paranoid.  He has stolen her cane, presumably, b/c he thinks people on the street are talking about him or may become  aggressive towards him, so mom thinks he may be thinking that he needs something to protect himself with.  Additionally, pt is paranoid regarding eating in front of others.  Mom is unsure why, but pt will not eat with his family.  He will wait for family to leave the dining room or kitchen before he partakes in a meal.  Mother states his behaviors are bizarre and worrisome.  She is happy the pt is back on zyprexa, but she worries about his compliance.     Associated Signs/Symptoms: Depression Symptoms:  agitation (Hypo) Manic Symptoms:  Distractibility, irritable mood at times, impulsivity Anxiety Symptoms:  Excessive Worry, Psychotic Symptoms:  Paranoia, pt denies AH, VH, but appear to be responding to internal stimuli PTSD Symptoms: none reported Total Time spent with patient, talking with his mother, talking with his treatment team and coordinating care: 1.5 hours  Past Psychiatric History: pt has  Diagnosis of schizophrenia (diagnosed ~2013).  He has been hospitalized in a psychiatric facility before.  He has been treated with zyprexa in the past and has done very well on the medication.  He has seen Dr. Maryruth Bun and Dr. Mare Ferrari in outpt, but has not seen either of them in a while.  He has a hx of medication noncompliance and self-injury.  Pt denies of violence towards others.  He denies hx of suicide attempts.     Is the patient at risk to self? Yes.    Has the patient been a  risk to self in the past 6 months? Yes.    Has the patient been a risk to self within the distant past? No.  Is the patient a risk to others? No.  Has the patient been a risk to others in the past 6 months? No.  Has the patient been a risk to others within the distant past? No.   Prior Inpatient Therapy:   Prior Outpatient Therapy:    Alcohol Screening: 1. How often do you have a drink containing alcohol?: Never 2. How many drinks containing alcohol do you have on a typical day when you are drinking?: 1 or 2 3. How  often do you have six or more drinks on one occasion?: Never AUDIT-C Score: 0 4. How often during the last year have you found that you were not able to stop drinking once you had started?: Never 5. How often during the last year have you failed to do what was normally expected from you becasue of drinking?: Never 6. How often during the last year have you needed a first drink in the morning to get yourself going after a heavy drinking session?: Never 7. How often during the last year have you had a feeling of guilt of remorse after drinking?: Never 8. How often during the last year have you been unable to remember what happened the night before because you had been drinking?: Never 9. Have you or someone else been injured as a result of your drinking?: No 10. Has a relative or friend or a doctor or another health worker been concerned about your drinking or suggested you cut down?: No Alcohol Use Disorder Identification Test Final Score (AUDIT): 0 Intervention/Follow-up: AUDIT Score <7 follow-up not indicated Substance Abuse History in the last 12 months:  No. pt denies excessive ETOH use; he denies hx of illicit substance use.   Consequences of Substance Abuse: NA Previous Psychotropic Medications: Yes  Psychological Evaluations: Yes  Past Medical History:  Past Medical History:  Diagnosis Date  . Seasonal affective disorder (HCC)    History reviewed. No pertinent surgical history. Family History: History reviewed. No pertinent family history. Family Psychiatric  History: none reported; pt denies family hx of suicide or suicide attempts Tobacco Screening: Have you used any form of tobacco in the last 30 days? (Cigarettes, Smokeless Tobacco, Cigars, and/or Pipes): No Social History:  Social History   Substance and Sexual Activity  Alcohol Use Never  . Frequency: Never     Social History   Substance and Sexual Activity  Drug Use Never    Additional Social History:  Pt lives in  [place] with his parents.  He was attending [employer] when he had his first psychotic break.  He was unable to graduate from college.  He's worked at [employer] in the past.  He's currently working at a [employer].  He denies being in a relationship, but identifies as heterosexual.  He plays video games for fun.  He reports that he's [place].  No hx of abuse reported.     Allergies:   Allergies  Allergen Reactions  . Haldol [Haloperidol Lactate] Other (See Comments)    Involuntary movements  . Other     Pt says he has sensitive skin-gets bacterial infections easily; has to be careful with laundry detergent and dishwasher soap   Lab Results:  Results for orders placed or performed during the hospital encounter of 11/25/18 (from the past 48 hour(s))  Hemoglobin A1c  Status: None   Collection Time: 11/25/18 12:50 AM  Result Value Ref Range   Hgb A1c MFr Bld 5.3 4.8 - 5.6 %    Comment: (NOTE) Pre diabetes:          5.7%-6.4% Diabetes:              >6.4% Glycemic control for   <7.0% adults with diabetes    Mean Plasma Glucose 105.41 mg/dL    Comment: Performed at [facility], [address] [place], [place] [address]  Lipid panel     Status: Abnormal   Collection Time: 11/25/18 12:50 AM  Result Value Ref Range   Cholesterol 193 0 - 200 mg/dL   Triglycerides 63 <604<150 mg/dL   HDL 48 >54>40 mg/dL   Total CHOL/HDL Ratio 4.0 RATIO   VLDL 13 0 - 40 mg/dL   LDL Cholesterol 098132 (H) 0 - 99 mg/dL    Comment:        Total Cholesterol/HDL:CHD Risk Coronary Heart Disease Risk Table                     Men   Women  1/2 Average Risk   3.4   3.3  Average Risk       5.0   4.4  2 X Average Risk   9.6   7.1  3 X Average Risk  23.4   11.0        Use the calculated Patient Ratio above and the CHD Risk Table to determine the patient's CHD Risk.        ATP III CLASSIFICATION (LDL):  <100     mg/dL   Optimal  [id number]  mg/dL   Near or Above                     Optimal  130-159  mg/dL   Borderline  [id number]  mg/dL   High  >130>190     mg/dL   Very High Performed at [facility], [address] [place], [place] [address]   TSH     Status: Abnormal   Collection Time: 11/25/18 12:50 AM  Result Value Ref Range   TSH 5.200 (H) 0.350 - 4.500 uIU/mL    Comment: Performed by a 3rd Generation assay with a functional sensitivity of <=0.01 uIU/mL. Performed at [facility], [address] [place], [place] [address]     Blood Alcohol level:  Lab Results  Component Value Date   [facility]10 11/25/2018    Metabolic Disorder Labs:  Lab Results  Component Value Date   HGBA1C 5.3 11/25/2018   MPG 105.41 11/25/2018   No results found for: PROLACTIN Lab Results  Component Value Date   CHOL 193 11/25/2018   TRIG 63 11/25/2018   HDL 48 11/25/2018   CHOLHDL 4.0 11/25/2018   VLDL 13 11/25/2018   LDLCALC 132 (H) 11/25/2018   LDLCALC 119 (H) 12/07/2012    Current Medications: Current Facility-Administered Medications  Medication Dose Route Frequency Provider Last Rate Last Dose  . acetaminophen (TYLENOL) tablet 650 mg  650 mg Oral Q6H PRN Clapacs, John T, MD      . alum & mag hydroxide-simeth (MAALOX/MYLANTA) 200-200-20 MG/5ML suspension 30 mL  30 mL Oral Q4H PRN Clapacs, John T, MD      . hydrOXYzine (ATARAX/VISTARIL) tablet 25 mg  25 mg Oral TID PRN Clapacs, John T, MD      . LORazepam (ATIVAN) injection 0.5 mg  0.5 mg Intramuscular Q6H PRN Clapacs, John T, MD      . magnesium hydroxide (MILK OF MAGNESIA) suspension 30 mL  30 mL Oral Daily PRN Clapacs, John T, MD      . OLANZapine (ZYPREXA) tablet 10 mg  10 mg Oral QHS Clapacs, Jackquline Denmark, MD   10 mg at 11/25/18 2142  . polymixin-bacitracin (POLYSPORIN) ointment   Topical BID Clapacs, Jackquline Denmark, MD   1 application at 11/26/18 [phone]  . traZODone (DESYREL) tablet 100 mg  100 mg Oral QHS PRN Clapacs, Jackquline Denmark, MD   100 mg at 11/25/18 2142   PTA Medications: No medications prior to  admission.    Musculoskeletal: Strength & Muscle Tone: within normal limits Gait & Station: normal Patient leans: N/A  Psychiatric Specialty Exam: Physical Exam  Nursing note and vitals reviewed. Constitutional: He is oriented to person, place, and time. He appears well-developed and well-nourished.  HENT:  Head: Normocephalic and atraumatic.  Eyes:  Difficult to assess pupils for accommodation as when light was shined in his eyes, he would close his eyes and redirection was futile; pt states he was treated with antibacterial eye drops a few weeks ago for bilateral conjunctivitis.   Cardiovascular: Normal rate and regular rhythm.  Respiratory: Effort normal and breath sounds normal.  GI: Soft. Bowel sounds are normal.  Musculoskeletal: Normal range of motion.  Neurological: He is alert and oriented to person, place, and time.  Skin: Skin is warm and dry.  Superficial scratch on the front his neck     Review of Systems  Constitutional: Negative for chills and fever.  HENT: Negative for sinus pain and sore throat.   Eyes: Negative for pain.  Respiratory: Negative for cough and shortness of breath.   Cardiovascular: Negative for chest pain.  Gastrointestinal: Negative for abdominal pain, constipation, diarrhea, heartburn, nausea and vomiting.  Genitourinary: Negative for dysuria.  Musculoskeletal: Negative for myalgias.  Neurological: Negative for dizziness, tremors, seizures and weakness.  Psychiatric/Behavioral: Negative for depression, substance abuse and suicidal ideas. The patient is not nervous/anxious and does not have insomnia.     Blood pressure 121/83, pulse 73, temperature 97.6 F (36.4 C), temperature source Oral, resp. rate 16, height 5\' 11"  (1.803 m), weight 68 kg, SpO2 100 %.Body mass index is 20.92 kg/m.  General Appearance: Fairly Groomed  Eye Contact:  Minimal  Speech:  coherent  Volume:  Decreased  Mood:  "okay"  Affect:  Flat  Thought Process:  Goal  Directed  Orientation:  Full (Time, Place, and Person)  Thought Content:  illogical at times  Suicidal Thoughts:  pt denies  Homicidal Thoughts:  pt denies  Memory:  grossly intact  Judgement:  Poor  Insight:  Lacking  Psychomotor Activity:  Decreased  Concentration:  Concentration: Fair and Attention Span: Fair  Recall:  Good  Fund of Knowledge:  Fair  Language:  Fair  Akathisia:  No  AIMS (if indicated):   0  Assets:  Desire for Improvement Housing Physical Health Resilience Social Support  ADL's:  Intact  Cognition:  WNL  Sleep:  Number of Hours: 7.3    Treatment Plan Summary: Daily contact with patient to assess and evaluate symptoms and progress in treatment, Medication management and Plan see below   Pt is a 27 yo male with schizophrenia who presents after superficially scratching his neck after being frustrated about not getting the hours he wanted at work.  Pt denies that it was a suicide attempt, but admits to  prior self-injurious behaviors as well.  He states he tends to get angry or frustrated easily.  Pt also has hx of paranoia and appears to respond to internal stimuli.  Pt consents to restart zyprexa, which he has done well on in the past.   Risks (including but not limited NMS, death) and benefits were discussed and the pt voices understanding and consents to the current medications and his treatment plan.    Schizophrenia -zyprexa 10 mg qhs -QTc 409 -encourage compliance  Abnormal TSH (elevated at 5.2) -will get repeat TSH with free T3 and Free T4  Superficial scratch on front of pt's neck -antibiotic ointment  Observation Level/Precautions:  15 minute checks  Laboratory:  see lab orders; in addition to labs already ordered, will get RPR, Vit B12 to rule out any other causes for his thought disorder and mood dysregulation  Psychotherapy:  group  Medications:  See mar  Consultations:  N/A  Discharge Concerns: will need outpt psychiatrist at discharge     Estimated LOS: 4-7 days  Other:  Pt has hx of med noncompliance so psychoeducation will need to be an ongoing discussion   Physician Treatment Plan for Primary Diagnosis: Schizophrenia (HCC) Long Term Goal(s): Improvement in symptoms so as ready for discharge  Short Term Goals: Ability to identify changes in lifestyle to reduce recurrence of condition will improve, Ability to verbalize feelings will improve, Ability to demonstrate self-control will improve, Ability to identify and develop effective coping behaviors will improve and Compliance with prescribed medications will improve  Physician Treatment Plan for Secondary Diagnosis: Principal Problem:   Schizophrenia (HCC)  Long Term Goal(s): Improvement in symptoms so as ready for discharge  Short Term Goals: Ability to identify changes in lifestyle to reduce recurrence of condition will improve, Ability to verbalize feelings will improve, Ability to demonstrate self-control will improve, Ability to identify and develop effective coping behaviors will improve, Compliance with prescribed medications will improve and Ability to identify triggers associated with substance abuse/mental health issues will improve  I certify that inpatient services furnished can reasonably be expected to improve the patient's condition.    Hessie Knows, MD 12/10/20197:01 PM

## 2018-11-26 NOTE — Progress Notes (Signed)
D: Patient stated slept good last night .Stated appetite is good and energy level  Is normal. Stated concentration is good .Denies suicidal  homicidal ideations  .  No auditory hallucinations  No pain concerns . Appropriate ADL'S. Interacting with peers and staff. Able to understand information received  [facility]  redirected  when needed .  Working on concerns  that impact his daily living , coping skills , decision making.Patient  Is not  talking about his self or expressing  . Compliant  with  medication and understand their use . Voice of no safety concerns   . Attending  unit programing Thoughts  remain altered . Responding to internal stimuli  Isolates in room with peers  Limited interaction. A: Encourage patient participation with unit programming . Instruction  Given on  Medication , verbalize understanding. R: Voice no other concerns. Staff continue to monitor

## 2018-11-26 NOTE — Plan of Care (Signed)
Able to understand information received  from [facility]  redirected  when needed .  Working on concerns  that impact his daily living , coping skills , decision making. Able to talk about his self and expressing  . Compliant  with  medication and understand their use . Voice of no safety concerns  Mental and emotional status improving . Attending  unit programing Thoughts  remain altered . Problem: Education: Goal: Knowledge of [facility] Education information/materials will improve Outcome: Progressing Goal: Emotional status will improve Outcome: Progressing Goal: Mental status will improve Outcome: Progressing Goal: Verbalization of understanding the information provided will improve Outcome: Progressing   Problem: Coping: Goal: Coping ability will improve Outcome: Progressing   Problem: Safety: Goal: Ability to disclose and discuss suicidal ideas will improve Outcome: Progressing Goal: Ability to identify and utilize support systems that promote safety will improve Outcome: Progressing

## 2018-11-26 NOTE — [facility] Suicide Risk Assessment (Signed)
[facility] Admission Suicide Risk Assessment   Nursing information obtained from:  Patient Demographic factors:  Male, Unemployed Current Mental Status:  NA(Denies) Loss Factors:  Financial problems / change in socioeconomic status Historical Factors:  NA Risk Reduction Factors:  Sense of responsibility to family  Total Time spent with patient, talking with his mother, talking with his treatment team and coordinating care: 1.5 hours  Principal Problem: Schizophrenia (HCC) Diagnosis:  Principal Problem:   Schizophrenia (HCC)  Subjective Data:  [name] is a 27 yo male with schizophrenia and history of medication noncompliance who was admitted after scratching his neck superficially with the end of his glasses.  Pt states the reason he scratched himself was b/c he was feeling frustrated about not receiving more hours at his current job.  Pt denies that it was a suicide attempt.  Pt works in the [employer] at [employer].  Pt states that working keeps his mind occupied and allows him to be around peers his age.  When he's not working, he's playing video games.  Pt states he feels isolated at times and bored.  Pt admits to hx of anger outbursts leading to self-injurious behaviors.  For example 1 month ago, pt became upset over something and used a piece of wood to nick his right pointer finger.  Pt shows me the small scar on his finger.  Pt has been observed by staff on the unit talking to self and having a conversation with no one there.  However, pt denies AH, VH, SI, HI.  Pt provides verbal and written consent for me to speak to his mother, Lanier Clam [phone]. She states that the pt was diagnosed with schizophrenia in ~2013.  He did well on Zyprexa for a few months and then pt stopped taking it on his own.  Mom states during the time the pt was compliant with his medication, he was able work and keep his emotions under control.  Off his medication, mother has heard  the pt talking to himself or having conversations with others that are not there; she states he is paranoid.  He has stolen her cane, presumably, b/c he thinks people on the street are talking about him or may become aggressive towards him, so mom thinks he may be thinking that he needs something to protect himself with.  Additionally, pt is paranoid regarding eating in front of others.  Mom is unsure why, but pt will not eat with his family.  He will wait for family to leave the dining room or kitchen before he partakes in a meal.  Mother states his behaviors are bizarre and worrisome.  She is happy the pt is back on zyprexa, but she worries about his compliance.     Continued Clinical Symptoms:  Alcohol Use Disorder Identification Test Final Score (AUDIT): 0 The "Alcohol Use Disorders Identification Test", Guidelines for Use in Primary Care, Second Edition.  World Science writer [facility]). Score between 0-7:  no or low risk or alcohol related problems. Score between 8-15:  moderate risk of alcohol related problems. Score between 16-19:  high risk of alcohol related problems. Score 20 or above:  warrants further diagnostic evaluation for alcohol dependence and treatment.   CLINICAL FACTORS:  Associated Signs/Symptoms: Depression Symptoms:  agitation (Hypo) Manic Symptoms:  Distractibility, irritable mood at times, impulsivity Anxiety Symptoms:  Excessive Worry, Psychotic Symptoms:  Paranoia, pt denies AH, VH, but appear to be responding to internal stimuli PTSD Symptoms: none reported  Musculoskeletal: Strength & Muscle Tone: within normal limits Gait & Station: normal Patient leans: N/A  Psychiatric Specialty Exam: Physical Exam  Nursing note and vitals reviewed. Constitutional: He is oriented to person, place, and time. He appears well-developed and well-nourished.  HENT:  Head: Normocephalic and atraumatic.  Eyes:  Difficult to assess pupils for accommodation as when light  was shined in his eyes, he would close his eyes and redirection was futile; pt states he was treated with antibacterial eye drops a few weeks ago for bilateral conjunctivitis.   Cardiovascular: Normal rate and regular rhythm.  Respiratory: Effort normal and breath sounds normal.  GI: Soft. Bowel sounds are normal.  Musculoskeletal: Normal range of motion.  Neurological: He is alert and oriented to person, place, and time.  Skin: Skin is warm and dry.  Superficial scratch on the front his neck    ROS  Constitutional: Negative for chills and fever.  HENT: Negative for sinus pain and sore throat.   Eyes: Negative for pain.  Respiratory: Negative for cough and shortness of breath.   Cardiovascular: Negative for chest pain.  Gastrointestinal: Negative for abdominal pain, constipation, diarrhea, heartburn, nausea and vomiting.  Genitourinary: Negative for dysuria.  Musculoskeletal: Negative for myalgias.  Neurological: Negative for dizziness, tremors, seizures and weakness.  Psychiatric/Behavioral: Negative for depression, substance abuse and suicidal ideas. The patient is not nervous/anxious and does not have insomnia.     Blood pressure 121/83, pulse 73, temperature 97.6 F (36.4 C), temperature source Oral, resp. rate 16, height 5\' 11"  (1.803 m), weight 68 kg, SpO2 100 %.Body mass index is 20.92 kg/m.   General Appearance: Fairly Groomed  Eye Contact:  Minimal  Speech:  coherent  Volume:  Decreased  Mood:  "okay"  Affect:  Flat  Thought Process:  Goal Directed  Orientation:  Full (Time, Place, and Person)  Thought Content:  illogical at times  Suicidal Thoughts:  pt denies  Homicidal Thoughts:  pt denies  Memory:  grossly intact  Judgement:  Poor  Insight:  Lacking  Psychomotor Activity:  Decreased  Concentration:  Concentration: Fair and Attention Span: Fair  Recall:  Good  Fund of Knowledge:  Fair  Language:  Fair  Akathisia:  No  AIMS (if indicated):   0  Assets:   Desire for Improvement Housing Physical Health Resilience Social Support  ADL's:  Intact  Cognition:  WNL  Sleep:  Number of Hours: 7.3     COGNITIVE FEATURES THAT CONTRIBUTE TO RISK:  Thought constriction (tunnel vision)    SUICIDE RISK:  Risk factors: hx of self-injurious behaviors; hx of mood dysregulation and paranoia related to his diagnosis of schizophrenia, hx of agitation/irritablity; hx of med noncompliance Protective factors: denial of active suicidality, denial of hx of suicide attempts, denial of homicidal ideation; social support from parents Acute suicide risk: low as pt is currently denying suicidal ideation and is in a safe environment.  Pt contracts for safety Chronic suicide risk: elevated given his hx of impulsive behaviors, medication noncompliance and diagnosis of schizophrenia.   PLAN OF CARE:  Pt is a 27 yo male with schizophrenia who presents after superficially scratching his neck after being frustrated about not getting the hours he wanted at work.  Pt denies that it was a suicide attempt, but admits to prior self-injurious behaviors as well.  He states he tends to get angry or frustrated easily.  Pt also has hx of paranoia and appears to respond to internal stimuli.  Pt consents to restart  zyprexa, which he has done well on in the past.   Risks (including but not limited NMS, death) and benefits were discussed and the pt voices understanding and consents to the current medications and his treatment plan.    Schizophrenia -zyprexa 10 mg qhs -QTc 409 -encourage compliance  Abnormal TSH (elevated at 5.2) -will get repeat TSH with free T3 and Free T4  Superficial scratch on front of pt's neck -antibiotic ointment  I certify that inpatient services furnished can reasonably be expected to improve the patient's condition.   Hessie Knows, MD 11/26/2018, 7:37 PM

## 2018-11-26 NOTE — Progress Notes (Signed)
Patient ID: Roger ShiverJaise Jimenez, male   DOB: 03/15/1991, 27 y.o.   MRN: [id number] DAR Note: Pt with flat and worried affect observed in the dayroom not interacting. Pt at assessment denied any anxiety, depression, pain, AVH, SI or HI. Pt was however, observing having conversation with self. Encouragement and Safe environment provided. All Pt's questions and concerns addressed. Pt was med compliant. 15 min safety checks continue for Pt's safety. Pt was med compliant.

## 2018-11-27 LAB — TSH: TSH: 4.93 u[IU]/mL — ABNORMAL HIGH (ref 0.350–4.500)

## 2018-11-27 LAB — VITAMIN B12: VITAMIN B 12: 339 pg/mL (ref 180–914)

## 2018-11-27 LAB — T4, FREE: Free T4: 0.83 ng/dL (ref 0.82–1.77)

## 2018-11-27 MED ORDER — DOUBLE ANTIBIOTIC 500-10000 UNIT/GM EX OINT
TOPICAL_OINTMENT | Freq: Two times a day (BID) | CUTANEOUS | Status: DC | PRN
  Administered 2018-11-29: 21:00:00 via TOPICAL
  Filled 2018-11-27: qty 28.4

## 2018-11-27 NOTE — Progress Notes (Signed)
Recreation Therapy Notes  INPATIENT RECREATION THERAPY ASSESSMENT  Patient Details Name: Koren ShiverJaise Pasquini MRN: [id number] DOB: [birth year] Today's Date: 11/27/2018       Information Obtained From: Patient  Able to Participate in Assessment/Interview: Yes  Patient Presentation: Responsive  Reason for Admission (Per Patient): Active Symptoms, Other (Comments)(Frustrated)  Patient Stressors:    Coping Skills:   TV, Exercise, Other (Comment)(Cooking, Cleaning)  Leisure Interests (2+):  Social - Friends(Church)  Frequency of Recreation/Participation: Monthly  Awareness of Community Resources:  Yes  Community Resources:  Gym, Warehouse managerChurch  Current Use: Yes  If no, Barriers?:    Expressed Interest in [employer] CorporationCommunity Resource Information:    [place]  Patient Main Form of Transportation: Set designerCar  Patient Strengths:  Trust worthy, clean, on time  Patient Identified Areas of Improvement:  my happiness  Patient Goal for Hospitalization:  Get back into school and become independent   Current SI (including self-harm):  No  Current HI:  No  Current AVH: No  Staff Intervention Plan: Group Attendance, Collaborate with Interdisciplinary Treatment Team  Consent to Intern Participation: N/A  Relda Agosto 11/27/2018, 2:56 PM

## 2018-11-27 NOTE — Progress Notes (Signed)
[facility] MD Progress Note  11/27/2018 4:48 PM [name]  MRN:  [id number] Subjective:  Pt states he is feeling a drowsy in the morning, but tends to feel better after eating breakfast.  He states he's just having to get use to taking zyprexa again.  Other than drowsiness, pt denies any side effects.   He is observed to be interacting appropriately with staff and peers.   He is willing to go to therapy and get med management at [facility].   Pt states his goals are to become more independent and eventually move out of his parent's home and to go back to school.    Principal Problem: Schizophrenia (HCC) Diagnosis: Principal Problem:   Schizophrenia (HCC)  Total Time spent with patient, discussing pt with his treatment team, and coordinating care: 35 min   Past Psychiatric History: pt has  Diagnosis of schizophrenia (diagnosed ~2013).  He has been hospitalized in a psychiatric facility before.  He has been treated with zyprexa in the past and has done very well on the medication.  He has seen Dr. Maryruth Bun and Dr. Mare Ferrari in outpt, but has not seen either of them in a while.  He has a hx of medication noncompliance and self-injury.  Pt denies of violence towards others.  He denies hx of suicide attempts.      Past Medical History:  Past Medical History:  Diagnosis Date  . Seasonal affective disorder (HCC)    History reviewed. No pertinent surgical history. Family History: History reviewed. No pertinent family history. Family Psychiatric  History: none reported; pt denies family hx of suicide or suicide attempts Social History:  Social History   Substance and Sexual Activity  Alcohol Use Never  . Frequency: Never     Social History   Substance and Sexual Activity  Drug Use Never    Social History   Socioeconomic History  . Marital status: Single    Spouse name: Not on file  . Number of children: Not on file  . Years of education: Not on file  . Highest education level: Not on file   Occupational History  . Not on file  Social Needs  . Financial resource strain: Not on file  . Food insecurity:    Worry: Not on file    Inability: Not on file  . Transportation needs:    Medical: Not on file    Non-medical: Not on file  Tobacco Use  . Smoking status: Never Smoker  . Smokeless tobacco: Never Used  Substance and Sexual Activity  . Alcohol use: Never    Frequency: Never  . Drug use: Never  . Sexual activity: Not on file  Lifestyle  . Physical activity:    Days per week: Not on file    Minutes per session: Not on file  . Stress: Not on file  Relationships  . Social connections:    Talks on phone: Not on file    Gets together: Not on file    Attends religious service: Not on file    Active member of club or organization: Not on file    Attends meetings of clubs or organizations: Not on file    Relationship status: Not on file  Other Topics Concern  . Not on file  Social History Narrative  . Not on file   Additional Social History:  Pt lives in [place] with his parents.  He was attending [employer] when he had his first psychotic break.  He was  unable to graduate from college.  He's worked at [employer] in the past.  He's currently working at a [employer].  He denies being in a relationship, but identifies as heterosexual.  He plays video games for fun.  He reports that he's [place].  No hx of abuse reported.   Sleep: Good  Appetite:  Good  Current Medications: Current Facility-Administered Medications  Medication Dose Route Frequency Provider Last Rate Last Dose  . acetaminophen (TYLENOL) tablet 650 mg  650 mg Oral Q6H PRN Clapacs, John T, MD      . alum & mag hydroxide-simeth (MAALOX/MYLANTA) 200-200-20 MG/5ML suspension 30 mL  30 mL Oral Q4H PRN Clapacs, John T, MD      . hydrOXYzine (ATARAX/VISTARIL) tablet 25 mg  25 mg Oral TID PRN Clapacs, Jackquline Denmark, MD   25 mg at 11/26/18 2250  . LORazepam (ATIVAN) injection  0.5 mg  0.5 mg Intramuscular Q6H PRN Clapacs, John T, MD      . magnesium hydroxide (MILK OF MAGNESIA) suspension 30 mL  30 mL Oral Daily PRN Clapacs, John T, MD      . OLANZapine (ZYPREXA) tablet 10 mg  10 mg Oral QHS Clapacs, John T, MD   10 mg at 11/26/18 2250  . polymixin-bacitracin (POLYSPORIN) ointment   Topical BID Clapacs, Jackquline Denmark, MD   1 application at 11/26/18 [id number]  . polymixin-bacitracin (POLYSPORIN) ointment   Topical BID PRN Hessie Knows, MD      . traZODone (DESYREL) tablet 100 mg  100 mg Oral QHS PRN Clapacs, Jackquline Denmark, MD   100 mg at 11/25/18 2142    Lab Results:  Results for orders placed or performed during the hospital encounter of 11/25/18 (from the past 48 hour(s))  TSH     Status: Abnormal   Collection Time: 11/27/18  7:27 AM  Result Value Ref Range   TSH 4.930 (H) 0.350 - 4.500 uIU/mL    Comment: Performed by a 3rd Generation assay with a functional sensitivity of <=0.01 uIU/mL. Performed at [facility], [address] [place], [place] [address]   T4, free     Status: None   Collection Time: 11/27/18  7:27 AM  Result Value Ref Range   Free T4 0.83 0.82 - 1.77 ng/dL    Comment: (NOTE) Biotin ingestion may interfere with free T4 tests. If the results are inconsistent with the TSH level, previous test results, or the clinical presentation, then consider biotin interference. If needed, order repeat testing after stopping biotin. Performed at [facility], [address] [place], [place] [address]   Vitamin B12     Status: None   Collection Time: 11/27/18  7:27 AM  Result Value Ref Range   Vitamin B-12 339 180 - 914 pg/mL    Comment: (NOTE) This assay is not validated for testing neonatal or myeloproliferative syndrome specimens for Vitamin B12 levels. Performed at [facility], [address], [place], [place] [address]     Blood Alcohol level:  Lab Results  Component Value Date   ETH <10 11/25/2018    Metabolic Disorder  Labs: Lab Results  Component Value Date   HGBA1C 5.3 11/25/2018   MPG 105.41 11/25/2018   No results found for: PROLACTIN Lab Results  Component Value Date   CHOL 193 11/25/2018   TRIG 63 11/25/2018   HDL 48 11/25/2018   CHOLHDL 4.0 11/25/2018   VLDL 13 11/25/2018   LDLCALC 132 (H) 11/25/2018  LDLCALC 119 (H) 12/07/2012    Physical Findings: AIMS:  , ,  ,  ,    CIWA:    COWS:     Musculoskeletal: Strength & Muscle Tone: within normal limits Gait & Station: normal Patient leans: N/A  Psychiatric Specialty Exam: Physical Exam  Nursing note and vitals reviewed. Respiratory: Effort normal.    Review of Systems  Psychiatric/Behavioral: Negative for suicidal ideas.    Blood pressure (!) 142/95, pulse 69, temperature 97.6 F (36.4 C), temperature source Oral, resp. rate 16, height 5\' 11"  (1.803 m), weight 68 kg, SpO2 100 %.Body mass index is 20.92 kg/m.  General Appearance: Fairly Groomed  Eye Contact:  Fair  Speech:  Clear and Coherent  Volume:  Normal  Mood:  "okay"  Affect:  Flat  Thought Process:  Linear  Orientation:  Full (Time, Place, and Person)  Thought Content:  Logical  Suicidal Thoughts:  patient denies  Homicidal Thoughts:  patient denies  Memory:  intact  Judgement:  Fair  Insight:  Fair  Psychomotor Activity:  mildly delayed  Concentration:  Concentration: Fair and Attention Span: Fair  Recall:  [employer]Fair  Fund of Knowledge:  Fair  Language:  Fair  Akathisia:  No  AIMS (if indicated):     Assets:  Communication Skills Desire for Improvement Physical Health Resilience Social Support  ADL's:  Intact  Cognition:  WNL  Sleep:  Number of Hours: 5.75     Treatment Plan Summary: Daily contact with patient to assess and evaluate symptoms and progress in treatment, Medication management and Plan see below   Pt is a 27 yo male with schizophrenia who presents after superficially scratching his neck after being frustrated about not getting the hours  he wanted at work.  Pt denies that it was a suicide attempt, but admits to prior self-injurious behaviors as well.  He states he tends to get angry or frustrated easily.  Pt also has hx of paranoia and appears to respond to internal stimuli at times; today he appeared less internally preoccupied.  He was observed interacting well with his peers and staff. Pt consented to restart zyprexa, which he has done well on in the past.   Risks (including but not limited NMS, death) and benefits were discussed and the pt voiced understanding and consented to the current medications and his treatment plan.    Schizophrenia -zyprexa 10 mg qhs -QTc 409 Vit B12 low normal at 339  Abnormal TSH (elevated at 5.2) -repeat TSH was elevated at 4.930; free T4 was 0.83 (low end of normal)  Superficial scratch on front of pt's neck -antibiotic ointment prn  Observation Level/Precautions:  15 minute checks  Laboratory:  see lab orders  Psychotherapy:  group  Medications:  See mar  Consultations:  N/A  Discharge Concerns: will need outpt psychiatrist at discharge - likely [facility]  Tentative Discharge date: 12/16  Other:  Pt has hx of med noncompliance so psychoeducation will need to be an ongoing discussion     Hessie KnowsSarita O'Neal, MD 11/27/2018, 4:48 PM

## 2018-11-27 NOTE — Progress Notes (Addendum)
D: Patient stated slept good last night .Stated appetite is good and energy level  Is normal. Stated concentration is good . Stated on Depression scale , hopeless and anxiety .( low 0-10 high) Denies suicidal  homicidal ideations  .Denies  auditory hallucinations but continues to mumble to staff  When assisting  Him   No pain concerns . Appropriate ADL'S. No  Interacting with peers and staff.  Not  Able to talk about his self . Compliant  with  medication and understand their use . Voice of no safety concerns  Mental and emotional status improving . Attending  unit programing Thoughts  remain altered .Able to understand [facility]  redirected  when needed .Working on concerns  that impact his daily living , coping skills , decision making.  Isolates to room .   A: Encourage patient participation with unit programming . Instruction  Given on  Medication , verbalize understanding. R: Voice no other concerns. Staff continue to monitor

## 2018-11-27 NOTE — [facility] Group Notes (Signed)
[facility] Group Notes:  (Nursing/MHT/Case Management/Adjunct)  Date:  11/27/2018  Time:  9:55 AM  Type of Therapy:  Psychoeducational Skills  Participation Level:  Active  Participation Quality:  Appropriate, Drowsy, Sharing and Supportive  Affect:  Appropriate  Cognitive:  Alert and Oriented  Insight:  Good  Engagement in Group:  Engaged and Supportive  Modes of Intervention:  Activity, Clarification, Discussion, Education and Exploration  Summary of Progress/Problems:  GrenadaBrittany M Almedia Cordell 11/27/2018, 9:55 AM

## 2018-11-27 NOTE — Plan of Care (Signed)
Working on concerns  that impact his daily living , coping skills , decision making. Able to talk about his self and expressing  . Compliant  with  medication and understand their use . Voice of no safety concerns  Mental and emotional status improving . Attending  unit programing Thoughts  remain altered .Able to understand [facility]  [facility]  redirected  when needed .   Problem: Education: Goal: Knowledge of [place] [facility] Education information/materials will improve Outcome: Progressing Goal: Emotional status will improve Outcome: Progressing Goal: Mental status will improve Outcome: Progressing Goal: Verbalization of understanding the information provided will improve Outcome: Progressing   Problem: Coping: Goal: Coping ability will improve Outcome: Progressing   Problem: Safety: Goal: Ability to disclose and discuss suicidal ideas will improve Outcome: Progressing Goal: Ability to identify and utilize support systems that promote safety will improve Outcome: Progressing

## 2018-11-27 NOTE — Plan of Care (Signed)
  Problem: Education: Goal: Mental status will improve Outcome: Progressing  Patient alert and oriented x 4 
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
## 2018-11-27 NOTE — Tx Team (Addendum)
Interdisciplinary Treatment and Diagnostic Plan Update  11/27/2018 Time of Session: 1048 [name] MRN: [id number]  Principal Diagnosis: Schizophrenia [facility])  Secondary Diagnoses: Principal Problem:   Schizophrenia (HCC)   Current Medications:  Current Facility-Administered Medications  Medication Dose Route Frequency Provider Last Rate Last Dose  . acetaminophen (TYLENOL) tablet 650 mg  650 mg Oral Q6H PRN Clapacs, John T, MD      . alum & mag hydroxide-simeth (MAALOX/MYLANTA) 200-200-20 MG/5ML suspension 30 mL  30 mL Oral Q4H PRN Clapacs, John T, MD      . hydrOXYzine (ATARAX/VISTARIL) tablet 25 mg  25 mg Oral TID PRN Clapacs, Jackquline Denmark, MD   25 mg at 11/26/18 2250  . LORazepam (ATIVAN) injection 0.5 mg  0.5 mg Intramuscular Q6H PRN Clapacs, John T, MD      . magnesium hydroxide (MILK OF MAGNESIA) suspension 30 mL  30 mL Oral Daily PRN Clapacs, John T, MD      . OLANZapine (ZYPREXA) tablet 10 mg  10 mg Oral QHS Clapacs, John T, MD   10 mg at 11/26/18 2250  . polymixin-bacitracin (POLYSPORIN) ointment   Topical BID Clapacs, Jackquline Denmark, MD   1 application at 11/26/18 [phone]  . polymixin-bacitracin (POLYSPORIN) ointment   Topical BID PRN Hessie Knows, MD      . traZODone (DESYREL) tablet 100 mg  100 mg Oral QHS PRN Clapacs, Jackquline Denmark, MD   100 mg at 11/25/18 2142   PTA Medications: No medications prior to admission.    Patient Stressors: Financial difficulties Medication change or noncompliance  Patient Strengths: Ability for insight Average or above average intelligence Capable of independent living Communication skills  Treatment Modalities: Medication Management, Group therapy, Case management,  1 to 1 session with clinician, Psychoeducation, Recreational therapy.   Physician Treatment Plan for Primary Diagnosis: Schizophrenia (HCC) Long Term Goal(s): Improvement in symptoms so as ready for discharge Improvement in symptoms so as ready for discharge   Short Term Goals:  Ability to identify changes in lifestyle to reduce recurrence of condition will improve Ability to verbalize feelings will improve Ability to demonstrate self-control will improve Ability to identify and develop effective coping behaviors will improve Compliance with prescribed medications will improve Ability to identify changes in lifestyle to reduce recurrence of condition will improve Ability to verbalize feelings will improve Ability to demonstrate self-control will improve Ability to identify and develop effective coping behaviors will improve Compliance with prescribed medications will improve Ability to identify triggers associated with substance abuse/mental health issues will improve  Medication Management: Evaluate patient's response, side effects, and tolerance of medication regimen.  Therapeutic Interventions: 1 to 1 sessions, Unit Group sessions and Medication administration.  Evaluation of Outcomes: Progressing  Physician Treatment Plan for Secondary Diagnosis: Principal Problem:   Schizophrenia (HCC)  Long Term Goal(s): Improvement in symptoms so as ready for discharge Improvement in symptoms so as ready for discharge   Short Term Goals: Ability to identify changes in lifestyle to reduce recurrence of condition will improve Ability to verbalize feelings will improve Ability to demonstrate self-control will improve Ability to identify and develop effective coping behaviors will improve Compliance with prescribed medications will improve Ability to identify changes in lifestyle to reduce recurrence of condition will improve Ability to verbalize feelings will improve Ability to demonstrate self-control will improve Ability to identify and develop effective coping behaviors will improve Compliance with prescribed medications will improve Ability to identify triggers associated with substance abuse/mental health issues will improve  Medication Management: Evaluate  patient's response, side effects, and tolerance of medication regimen.  Therapeutic Interventions: 1 to 1 sessions, Unit Group sessions and Medication administration.  Evaluation of Outcomes: Progressing   RN Treatment Plan for Primary Diagnosis: Schizophrenia (HCC) Long Term Goal(s): Knowledge of disease and therapeutic regimen to maintain health will improve  Short Term Goals: Ability to identify and develop effective coping behaviors will improve and Compliance with prescribed medications will improve  Medication Management: RN will administer medications as ordered by provider, will assess and evaluate patient's response and provide education to patient for prescribed medication. RN will report any adverse and/or side effects to prescribing provider.  Therapeutic Interventions: 1 on 1 counseling sessions, Psychoeducation, Medication administration, Evaluate responses to treatment, Monitor vital signs and CBGs as ordered, Perform/monitor CIWA, COWS, AIMS and Fall Risk screenings as ordered, Perform wound care treatments as ordered.  Evaluation of Outcomes: Progressing   LCSW Treatment Plan for Primary Diagnosis: Schizophrenia (HCC) Long Term Goal(s): Safe transition to appropriate next level of care at discharge, Engage patient in therapeutic group addressing interpersonal concerns.  Short Term Goals: Engage patient in aftercare planning with referrals and resources, Increase social support and Increase skills for wellness and recovery  Therapeutic Interventions: Assess for all discharge needs, 1 to 1 time with Social worker, Explore available resources and support systems, Assess for adequacy in community support network, Educate family and significant other(s) on suicide prevention, Complete Psychosocial Assessment, Interpersonal group therapy.  Evaluation of Outcomes: Progressing   Progress in Treatment: Attending groups: Yes. Participating in groups: Yes. Taking medication as  prescribed: Yes. Toleration medication: Yes. Family/Significant other contact made: No, will contact:  when given permission Patient understands diagnosis: Yes. Discussing patient identified problems/goals with staff: Yes. Medical problems stabilized or resolved: Yes. Denies suicidal/homicidal ideation: Yes. Issues/concerns per patient self-inventory: No. Other: none  New problem(s) identified: No, Describe:  none  New Short Term/Long Term Goal(s):  Patient Goals:  "get back to school, be independent"  Discharge Plan or Barriers:   Reason for Continuation of Hospitalization: Depression Medication stabilization  Estimated Length of Stay: 3-5 days.  Recreational Therapy: Patient Stressors: N/A Patient Goal: Patient will identify 3 positive coping skills strategies to use post d/c within 5 recreation therapy group sessions  Attendees: Patient:[name] 11/27/2018   Physician: Dr Flora Lipps'Neal, MD 11/27/2018   Nursing: Hulan AmatoGwen Farrish, RN 11/27/2018   RN Care Manager: 11/27/2018   Social Worker: Daleen SquibbGreg Wierda, LCSW 11/27/2018   Recreational Therapist: Danella DeisShay. Zahriah Roes CTRS, LRT 11/27/2018   Other:  11/27/2018   Other:  11/27/2018   Other: 11/27/2018        Scribe for Treatment Team: Lorri FrederickWierda, Gregory Jon, LCSW 11/27/2018 12:57 PM

## 2018-11-27 NOTE — [facility] Group Notes (Signed)
[facility] Group Notes:  (Nursing/MHT/Case Management/Adjunct)  Date:  11/27/2018  Time:  10:08 PM  Type of Therapy:  Group Therapy  Participation Level:  Active  Participation Quality:  Appropriate  Affect:  Appropriate  Cognitive:  Alert  Insight:  Good  Engagement in Group:  Engaged  Modes of Intervention:  Support  Summary of Progress/Problems:  [name] 11/27/2018, 10:08 PM

## 2018-11-27 NOTE — [facility] Group Notes (Signed)
Overcoming Obstacles  11/27/2018 1PM  Type of Therapy and Topic:  Group Therapy:  Overcoming Obstacles  Participation Level:  Minimal    Description of Group:    In this group patients will be encouraged to explore what they see as obstacles to their own wellness and recovery. They will be guided to discuss their thoughts, feelings, and behaviors related to these obstacles. The group will process together ways to cope with barriers, with attention given to specific choices patients can make. Each patient will be challenged to identify changes they are motivated to make in order to overcome their obstacles. This group will be process-oriented, with patients participating in exploration of their own experiences as well as giving and receiving support and challenge from other group members.   Therapeutic Goals: 1. Patient will identify personal and current obstacles as they relate to admission. 2. Patient will identify barriers that currently interfere with their wellness or overcoming obstacles.  3. Patient will identify feelings, thought process and behaviors related to these barriers. 4. Patient will identify two changes they are willing to make to overcome these obstacles:      Summary of Patient Progress Minimal interaction from pt during today's session. Pt sat quietly while member's discussed ways to overcome obstacles. Pt participated in today's ice breaker.    Therapeutic Modalities:   Cognitive Behavioral Therapy Solution Focused Therapy Motivational Interviewing Relapse Prevention Therapy    Lowella Dandyarren Nuvia Hileman, MSW, LCSW 11/27/2018 3:59 PM

## 2018-11-27 NOTE — Progress Notes (Signed)
Recreation Therapy Notes   Date: 11/27/2018  Time: 9:30 am  Location: Craft Room  Behavioral response: Appropriate  Intervention Topic: Emotions   Discussion/Intervention:  Group content on today was focused on emotions. The group identified what emotions are and why it is important to have emotions. Patients expressed some positive and negative emotions. Individuals gave some past experiences on how they normally dealt with emotions in the past. The group described some positive ways to deal with emotions in the future. Patients participated in the intervention "Name the Megan SalonSong" where individuals were given a chance to experience different emotions.  Clinical Observations/Feedback:  Patient came to group and defined emotions as happiness. He explained a lot of emotions come from being board.Individual participated in group and was social with peers and staff while participating in the intervention.  Jefferey Lippmann LRT/CTRS         Bernis Stecher 11/27/2018 11:51 AM

## 2018-11-27 NOTE — Plan of Care (Signed)
Able to talk about his self and expressing  . Compliant  with  medication and understand their use . Voice of no safety concerns  Mental and emotional status improving . Attending  unit programing Thoughts  remain altered .Able to understand [facility]  redirected  when needed .Working on concerns  that impact his daily living , coping skills , decision making.   Problem: Education: Goal: Knowledge of [facility] Education information/materials will improve 11/27/2018 0906 by Crist InfanteFarrish, Tykeria Wawrzyniak A, RN Outcome: Progressing 11/27/2018 0859 by Crist InfanteFarrish, Cellie Dardis A, RN Outcome: Progressing Goal: Emotional status will improve 11/27/2018 0906 by Crist InfanteFarrish, Izola Teague A, RN Outcome: Progressing 11/27/2018 0859 by Crist InfanteFarrish, Peirce Deveney A, RN Outcome: Progressing Goal: Mental status will improve 11/27/2018 0906 by Crist InfanteFarrish, Aniayah Alaniz A, RN Outcome: Progressing 11/27/2018 0859 by Crist InfanteFarrish, Kaidence Callaway A, RN Outcome: Progressing Goal: Verbalization of understanding the information provided will improve 11/27/2018 0906 by Crist InfanteFarrish, Larose Batres A, RN Outcome: Progressing 11/27/2018 0859 by Crist InfanteFarrish, Iolani Twilley A, RN Outcome: Progressing   Problem: Coping: Goal: Coping ability will improve 11/27/2018 0906 by Crist InfanteFarrish, Devonna Oboyle A, RN Outcome: Progressing 11/27/2018 0859 by Crist InfanteFarrish, Dione Mccombie A, RN Outcome: Progressing   Problem: Safety: Goal: Ability to disclose and discuss suicidal ideas will improve 11/27/2018 0906 by Crist InfanteFarrish, Demari Gales A, RN Outcome: Progressing 11/27/2018 0859 by Crist InfanteFarrish, Karron Goens A, RN Outcome: Progressing Goal: Ability to identify and utilize support systems that promote safety will improve 11/27/2018 0906 by Crist InfanteFarrish, Darus Hershman A, RN Outcome: Progressing 11/27/2018 0859 by Crist InfanteFarrish, Dionysios Massman A, RN Outcome: Progressing

## 2018-11-27 NOTE — Progress Notes (Signed)
patient alert and oriented x 4, denies SI/HI/AVH no distress noted nteracting appropriately with peers and staff. Patient thought are organized and coherent, receptive to treatment plan appropriate eye contact, 15 minutes safety checks maintained, will continue to monitor.

## 2018-11-28 LAB — RPR: RPR Ser Ql: NONREACTIVE

## 2018-11-28 LAB — T3, FREE: T3, Free: 3.2 pg/mL (ref 2.0–4.4)

## 2018-11-28 LAB — VITAMIN D 25 HYDROXY (VIT D DEFICIENCY, FRACTURES): Vit D, 25-Hydroxy: 24.3 ng/mL — ABNORMAL LOW (ref 30.0–100.0)

## 2018-11-28 NOTE — Plan of Care (Signed)
Patient stated that he had a good day and was getting use to being here and he has been able to sleep. Patient denies SI, HI, AVH, anxiety and depression.   Problem: Education: Goal: Emotional status will improve Outcome: Progressing Goal: Mental status will improve Outcome: Progressing

## 2018-11-28 NOTE — Plan of Care (Signed)
Problem: Education: Goal: Knowledge of [facility] Education information/materials will improve Outcome: Progressing   Problem: Coping: Goal: Coping ability will improve Outcome: Progressing   Problem: Safety: Goal: Ability to disclose and discuss suicidal ideas will improve Outcome: Progressing DAR Note: Pt visible in dayroom on initial approach. Presents guarded with flat affect and depressed mood. Denies SI, HI, AVH and pain when assessed. Eye contact is avertive with slow but logical speech. Pt observed with mild confusion at intervals during shift, standing and looking at telephone as it rang rather than picking it up when his call was transferred. Minimal interactions on as need basis. Pt did attend groups and was engaged with prompts. Pt reports he slept well last night with good appetite high energy and good concentration level. Rates his depression 4/10, hopelessness 3/10 and anxiety 6/10. Pt's goal for this shift is to "eat meals on time". Declined PRN Vistaril for c/o anxiety when offered.  Emotional support offered. Encouraged pt to voice concerns, Attend to ADLs and comply with current treatment regimen including groups. CSW made aware of mother's concerns related to pt's history of medication noncompliance due to insurance issues. Safety checks continues without self harm gestures or outburst to note at this time.  Pt is receptive to care. Tolerates all PO intake well. POC continues for safety and mood stability.

## 2018-11-28 NOTE — Progress Notes (Signed)
D - Patient was in the day room upon arrival to the unit. Patient was pleasant during assessment and medication administration. Patient presented flat but denied SI/HI/AVH, pain, anxiety and depression. Patient stated, "I had a good day and I am getting use to here." Patient was calm and cooperative on the unit. Patient interacted appropriately on the unit with peers and staff.   A - Patient was compliant with medication administration per MD orders. Patient given education. Patient given support and encouragement.   R - Patient being monitored Q 15 minutes for safety per unit protocol. Patient informed to let staff know if there are any problems or concerns on the unit. Patient remains safe at this time.

## 2018-11-28 NOTE — Progress Notes (Signed)
Patient alert and oriented x 4 , affect is flat but brightens upon approach, very polite and calm , denies SI/HI/AVH, his thoughts are organized and coherent no bizarre behavior note d. Patient was noted this evening visiting with parents . Patient parents expressed concerns about not having health insurance and they are worried he may not be able to afford his medication after discharge. They expressed to Clinical research associatewriter that they want social worker to help with resources I the community. Patient after visitation with parents expressed to writer he was worried about him not been able to get his medications. Patient is complaint with medication, interacting appropriately with peers and staff, 15 minutes safety checks maintained will continue to monitor.

## 2018-11-28 NOTE — Progress Notes (Signed)
Recreation Therapy Notes  Date: 11/28/2018  Time: 9:30 am  Location: Craft Room  Behavioral response: Appropriate  Intervention Topic: Coping skills  Discussion/Intervention:  and when they can be used. Individuals described how they normally cope with things and the coping skills they normally use. Patients expressed why it is important to cope with things and how not coping with things can affect you. The group participated in the intervention "Exploring coping skills" where they had a chance to test new coping skills they could use in the future.  Clinical Observations/Feedback:  Patient came to group and defined coping skills as a way to deal with stress. He stated that he watches movies and exercise as a way to cope with things. Individual participated in group and was social with peers and staff while participating in the intervention.  Mayre Bury LRT/CTRS         Aditri Louischarles 11/28/2018 11:10 AM

## 2018-11-28 NOTE — [facility] Suicide Risk Assessment (Signed)
[facility] INPATIENT:  Family/Significant Other Suicide Prevention Education  Suicide Prevention Education:  Education Completed; Phylliss BobJoy Runquist, mother [phone] has been identified by the patient as the family member/significant other with whom the patient will be residing, and identified as the person(s) who will aid the patient in the event of a mental health crisis (suicidal ideations/suicide attempt).  With written consent from the patient, the family member/significant other has been provided the following suicide prevention education, prior to the and/or following the discharge of the patient.  The suicide prevention education provided includes the following:  Suicide risk factors  Suicide prevention and interventions  National Suicide Hotline telephone number  [facility] assessment telephone number  [facility] Emergency Assistance 911  [facility] and/or Residential Mobile Crisis Unit telephone number  Request made of family/significant other to:  Remove weapons (e.g., guns, rifles, knives), all items previously/currently identified as safety concern.    Remove drugs/medications (over-the-counter, prescriptions, illicit drugs), all items previously/currently identified as a safety concern.  The family member/significant other verbalizes understanding of the suicide prevention education information provided.  The family member/significant other agrees to remove the items of safety concern listed above. Pt  Mother reports pt is allowed to return to her home at discharge but she would prefer he go to a group home. Mother reports when pt does not take his medication he becomes angry. She says does not feel safe living with him and locks herself in her bedroom. Mother says her husband travels a lot and she is left alone with him in the home. According to mother, the pt was seeing Dr. Maryruth BunKapur for med mgmt but stopped seeing her in 2013. Mother says he last saw a  psychiatrist in 2015 but currently [facility] and [facility]. Mother says she has no family or friends that live close by to offer respite. CSW encouraged mother to call 911 or bring pt to nearest emergency department should he need further medical attention. CSW addressed mother's questions about pt lack of insurance and encouraged her to contact DSS in the city in which they reside to find out what documents are needed to complete medicaid application and eligibility requirements.  Amaris Delafuente T Ezriel Boffa 11/28/2018, 12:10 PM

## 2018-11-28 NOTE — Plan of Care (Signed)
  Problem: Safety: Goal: Ability to disclose and discuss suicidal ideas will improve Outcome: Progressing  Patient demes SI

## 2018-11-28 NOTE — Progress Notes (Signed)
[facility] MD Progress Note  11/28/2018 9:14 AM [name]  MRN:  [id number] Subjective:  Pt reports that today his mood is pretty good.  He is med compliant.  Denies problems with the zyprexa; denies feeling stiff.  Mother has questions about cost of medication.  According to [employer], zyprexa 10 mg nightly is available for $9 for 1 month supply.  Pt states he can afford that.   Pt also agrees to go to [facility] for therapy and med management.   Pt plans to return home at discharge and hopefully get a new job so he can save money to move out on his own.    Pt is quiet but is going to groups.  He denies HI, SI, AH, VH.   Principal Problem: Schizophrenia (HCC) Diagnosis: Principal Problem:   Schizophrenia (HCC)  Total Time spent with patient, discussing pt with his treatment team, and coordinating care: 15 min   Past Psychiatric History: pt has  Diagnosis of schizophrenia (diagnosed ~2013).  He has been hospitalized in a psychiatric facility before.  He has been treated with zyprexa in the past and has done very well on the medication.  He has seen Dr. Maryruth Bun and Dr. Mare Ferrari in outpt, but has not seen either of them in a while.  He has a hx of medication noncompliance and self-injury.  Pt denies of violence towards others.  He denies hx of suicide attempts.      Past Medical History:  Past Medical History:  Diagnosis Date  . Seasonal affective disorder (HCC)    History reviewed. No pertinent surgical history. Family History: History reviewed. No pertinent family history. Family Psychiatric  History: none reported; pt denies family hx of suicide or suicide attempts Social History:  Social History   Substance and Sexual Activity  Alcohol Use Never  . Frequency: Never     Social History   Substance and Sexual Activity  Drug Use Never    Social History   Socioeconomic History  . Marital status: Single    Spouse name: Not on file  . Number of children: Not on file  . Years of  education: Not on file  . Highest education level: Not on file  Occupational History  . Not on file  Social Needs  . Financial resource strain: Not on file  . Food insecurity:    Worry: Not on file    Inability: Not on file  . Transportation needs:    Medical: Not on file    Non-medical: Not on file  Tobacco Use  . Smoking status: Never Smoker  . Smokeless tobacco: Never Used  Substance and Sexual Activity  . Alcohol use: Never    Frequency: Never  . Drug use: Never  . Sexual activity: Not on file  Lifestyle  . Physical activity:    Days per week: Not on file    Minutes per session: Not on file  . Stress: Not on file  Relationships  . Social connections:    Talks on phone: Not on file    Gets together: Not on file    Attends religious service: Not on file    Active member of club or organization: Not on file    Attends meetings of clubs or organizations: Not on file    Relationship status: Not on file  Other Topics Concern  . Not on file  Social History Narrative  . Not on file   Additional Social History:  Pt lives in [place] with  his parents.  He was attending [employer] when he had his first psychotic break.  He was unable to graduate from college.  He's worked at [employer] in the past.  He's currently working at a [employer].  He denies being in a relationship, but identifies as heterosexual.  He plays video games for fun.  He reports that he's [place].  No hx of abuse reported.   Sleep: Fair  Appetite:  Good  Current Medications: Current Facility-Administered Medications  Medication Dose Route Frequency Provider Last Rate Last Dose  . acetaminophen (TYLENOL) tablet 650 mg  650 mg Oral Q6H PRN Clapacs, John T, MD      . alum & mag hydroxide-simeth (MAALOX/MYLANTA) 200-200-20 MG/5ML suspension 30 mL  30 mL Oral Q4H PRN Clapacs, John T, MD      . hydrOXYzine (ATARAX/VISTARIL) tablet 25 mg  25 mg Oral TID PRN Clapacs, Jackquline Denmark, MD   25 mg at 11/26/18 2250  . LORazepam (ATIVAN) injection 0.5 mg  0.5 mg Intramuscular Q6H PRN Clapacs, John T, MD      . magnesium hydroxide (MILK OF MAGNESIA) suspension 30 mL  30 mL Oral Daily PRN Clapacs, John T, MD      . OLANZapine (ZYPREXA) tablet 10 mg  10 mg Oral QHS Clapacs, Jackquline Denmark, MD   10 mg at 11/27/18 2247  . polymixin-bacitracin (POLYSPORIN) ointment   Topical BID PRN Hessie Knows, MD      . traZODone (DESYREL) tablet 100 mg  100 mg Oral QHS PRN Clapacs, Jackquline Denmark, MD   100 mg at 11/27/18 2247    Lab Results:  Results for orders placed or performed during the hospital encounter of 11/25/18 (from the past 48 hour(s))  TSH     Status: Abnormal   Collection Time: 11/27/18  7:27 AM  Result Value Ref Range   TSH 4.930 (H) 0.350 - 4.500 uIU/mL    Comment: Performed by a 3rd Generation assay with a functional sensitivity of <=0.01 uIU/mL. Performed at [facility], [address] [place], [place] [address]   T4, free     Status: None   Collection Time: 11/27/18  7:27 AM  Result Value Ref Range   Free T4 0.83 0.82 - 1.77 ng/dL    Comment: (NOTE) Biotin ingestion may interfere with free T4 tests. If the results are inconsistent with the TSH level, previous test results, or the clinical presentation, then consider biotin interference. If needed, order repeat testing after stopping biotin. Performed at [facility], [address] [place], [place] [address]   T3, Free     Status: None   Collection Time: 11/27/18  7:27 AM  Result Value Ref Range   T3, Free 3.2 2.0 - 4.4 pg/mL    Comment: (NOTE) Performed At: [facility] [address] [place], [place] [id number] Jolene Schimke MD [id number]   VITAMIN D 25 Hydroxy (Vit-D Deficiency, Fractures)     Status: Abnormal   Collection Time: 11/27/18  7:27 AM  Result Value Ref Range   Vit D, 25-Hydroxy 24.3 (L) 30.0 - 100.0 ng/mL    Comment: (NOTE) Vitamin D deficiency has been defined by the  Institute of Medicine and an Endocrine Society practice guideline as a level of serum 25-OH vitamin D less than 20 ng/mL (1,2). The Endocrine Society went on to further define vitamin D insufficiency as a level between 21 and 29 ng/mL (2). 1. IOM (Institute of Medicine). 2010. Dietary  reference   intakes for calcium and D. [place]: The   [employer]. 2. Holick MF, Binkley , Bischoff-Ferrari HA, et al.   Evaluation, treatment, and prevention of vitamin D   deficiency: an Endocrine Society clinical practice   guideline. JCEM. 2011 Jul; 96(7):1911-30. Performed At: [facility] [address] [place], [place] [id number] Jolene Schimke MD MV:[id number]   Vitamin B12     Status: None   Collection Time: 11/27/18  7:27 AM  Result Value Ref Range   Vitamin B-12 339 180 - 914 pg/mL    Comment: (NOTE) This assay is not validated for testing neonatal or myeloproliferative syndrome specimens for Vitamin B12 levels. Performed at [facility], [address], [place], [place] [address]   RPR     Status: None   Collection Time: 11/27/18  7:27 AM  Result Value Ref Range   RPR Ser Ql Non Reactive Non Reactive    Comment: (NOTE) Performed At: [facility] [address] [place], [place] [id number] Jolene Schimke MD [id number]     Blood Alcohol level:  Lab Results  Component Value Date   [facility] <10 11/25/2018    Metabolic Disorder Labs: Lab Results  Component Value Date   HGBA1C 5.3 11/25/2018   MPG 105.41 11/25/2018   No results found for: PROLACTIN Lab Results  Component Value Date   CHOL 193 11/25/2018   TRIG 63 11/25/2018   HDL 48 11/25/2018   CHOLHDL 4.0 11/25/2018   VLDL 13 11/25/2018   LDLCALC 132 (H) 11/25/2018   LDLCALC 119 (H) 12/07/2012    Physical Findings: AIMS:  , ,  ,  ,    CIWA:    COWS:     Musculoskeletal: Strength & Muscle Tone: within normal limits Gait & Station: normal Patient leans: N/A  Psychiatric  Specialty Exam: Physical Exam  Nursing note and vitals reviewed. Respiratory: Effort normal.    Review of Systems  Psychiatric/Behavioral: Negative for suicidal ideas.    Blood pressure 133/87, pulse 91, temperature 97.6 F (36.4 C), temperature source Oral, resp. rate 18, height 5\' 11"  (1.803 m), weight 68 kg, SpO2 100 %.Body mass index is 20.92 kg/m.  General Appearance: Fairly Groomed  Eye Contact:  Fair  Speech:  Clear and Coherent  Volume:  Normal  Mood:  "good"  Affect:  Flat  Thought Process:  Linear  Orientation:  Full (Time, Place, and Person)  Thought Content:  Logical  Suicidal Thoughts:  patient denies  Homicidal Thoughts:  patient denies  Memory:  intact  Judgement:  Fair  Insight:  Fair  Psychomotor Activity:  mildly delayed  Concentration:  Concentration: Fair and Attention Span: Fair  Recall:  [employer] of Knowledge:  Fair  Language:  Fair  Akathisia:  No  AIMS (if indicated):     Assets:  Communication Skills Desire for Improvement Physical Health Resilience Social Support  ADL's:  Intact  Cognition:  WNL  Sleep:  Number of Hours: 5.15     Treatment Plan Summary: Daily contact with patient to assess and evaluate symptoms and progress in treatment, Medication management and Plan see below   Pt is a 26 yo male with schizophrenia who presents after superficially scratching his neck after being frustrated about not getting the hours he wanted at work.  Pt denies that it was a suicide attempt, but admits to prior self-injurious behaviors as well.  He states he tends to get angry or frustrated easily.  Pt also has  hx of paranoia and appears to respond to internal stimuli at times; today he appeared less internally preoccupied.  He was observed interacting well with his peers and staff. Pt consented to restart zyprexa, which he has done well on in the past.   Risks (including but not limited NMS, death) and benefits were discussed and the pt voiced  understanding and consented to the current medications and his treatment plan.    Schizophrenia -zyprexa 10 mg qhs -QTc 409 Vit B12 low normal at 339  Abnormal TSH (elevated at 5.2) -repeat TSH was elevated at 4.930; free T4 was 0.83 (low end of normal)  Superficial scratch on front of pt's neck -antibiotic ointment prn  Observation Level/Precautions:  15 minute checks  Laboratory:  see lab orders  Psychotherapy:  group  Medications:  See mar  Consultations:  N/A  Discharge Concerns: will need outpt psychiatrist at discharge - likely [facility]; will also need PCP to follow up with abnormal TSH level.   Tentative Discharge date: 12/16  Other:  Pt has hx of med noncompliance so psychoeducation will need to be an ongoing discussion     Hessie KnowsSarita O'Neal, MD 11/28/2018, 9:14 AM

## 2018-11-29 ENCOUNTER — Encounter: Payer: Self-pay | Admitting: Psychiatry

## 2018-11-29 MED ORDER — VITAMIN D 25 MCG (1000 UNIT) PO TABS
1000.0000 [IU] | ORAL_TABLET | Freq: Every day | ORAL | Status: DC
  Administered 2018-11-29 – 2018-12-02 (×4): 1000 [IU] via ORAL
  Filled 2018-11-29 (×4): qty 1

## 2018-11-29 NOTE — [facility] Group Notes (Signed)
[facility] Group Notes:  (Nursing/MHT/Case Management/Adjunct)  Date:  11/29/2018  Time:  2:52 PM  Type of Therapy:  Psychoeducational Skills  Participation Level:  Active  Participation Quality:  Appropriate  Affect:  Appropriate  Cognitive:  Appropriate  Insight:  Appropriate and Good  Engagement in Group:  Engaged  Modes of Intervention:  Activity and Education  Summary of Progress/Problems:  Lynelle SmokeCara Travis Gabrielly Mccrystal 11/29/2018, 2:52 PM

## 2018-11-29 NOTE — Progress Notes (Signed)
Recreation Therapy Notes  Date: 11/29/2018  Time: 9:30 am  Location: Craft Room  Behavioral response: Appropriate  Intervention Topic: Stress  Discussion/Intervention:  Group content on today was focused on stress. The group defined stress and ways to cope with stress. Participants expressed how they know when they are stresses out. Individuals described the different ways they have to cope with stress. The group stated reasons why it is important to cope with stress. Patient explained what good stress is and some examples. The group participated in the intervention "Stress Management Jeopardy". Individuals were separated into two group and answered questions related to stress.  Clinical Observations/Feedback:  Patient came to group and defined stress as a raise in heart rate and the way you react. He identified his family and exercising as a positive stress in his life. Participant explained that overeating is a negative way to deal with stress. Individual participated in group and was social with peers and staff while participating in the intervention.  Krystn Dermody LRT/CTRS          Preston Weill 11/29/2018 11:46 AM

## 2018-11-29 NOTE — [facility] Group Notes (Signed)
[facility] LCSW Group Therapy Note  Date/Time: 11/29/18, 1300  Type of Therapy/Topic:  Group Therapy:  Balance in Life  Participation Level:  active  Description of Group:    This group will address the concept of balance and how it feels and looks when one is unbalanced. Patients will be encouraged to process areas in their lives that are out of balance, and identify reasons for remaining unbalanced. Facilitators will guide patients utilizing problem- solving interventions to address and correct the stressor making their life unbalanced. Understanding and applying boundaries will be explored and addressed for obtaining  and maintaining a balanced life. Patients will be encouraged to explore ways to assertively make their unbalanced needs known to significant others in their lives, using other group members and facilitator for support and feedback.  Therapeutic Goals: 1. Patient will identify two or more emotions or situations they have that consume much of in their lives. 2. Patient will identify signs/triggers that life has become out of balance:  3. Patient will identify two ways to set boundaries in order to achieve balance in their lives:  4. Patient will demonstrate ability to communicate their needs through discussion and/or role plays  Summary of Patient Progress:Pt shared that friends, school, and financial are areas that are currently out of balance in life.  Pt active during group discussion regarding positive ways to set boundaries and regain balance and made several good comments.            Therapeutic Modalities:   Cognitive Behavioral Therapy Solution-Focused Therapy Assertiveness Training  Roger SquibbGreg Luisenrique Jimenez, [place]

## 2018-11-29 NOTE — Progress Notes (Signed)
DAR Note: Pt observed in milieu and in his room at intervals during shift. Continues to be mildly confused on interactions with depressed affect and mood. Denies SI, HI, AVH and pain when assessed. Tolerates meals well. Attended groups as scheduled. Tolerates all meals well. Continued support offered to pt. BP rechecked due to elevated reading this morning. Per pt "maybe because I'm eating all that bacon and salty food here, at home I do vegan diet". Support and encouragement offered. Q 15 minutes safety checks maintained without incident to note thus far.  Pt remains safe on and off unit. Denies concerns at this time.

## 2018-11-29 NOTE — Progress Notes (Addendum)
[facility] MD Progress Note  11/29/2018 6:00 PM [name]  MRN:  [id number] Subjective: Pt is having a good day.  He's attending groups; has been more social today. Compliant with medications; denies any issues with his meds.  Pt states he talked with his sister a couple of days ago about going to a group home and gaining independence.  Pt is actually in favor of moving out of his parent's home and going to a group home, but he needs insurance/disability first.  Pt has appointment at the social security office for medicaid and disability in Jan 2020.   He denies HI, SI, AH, VH.   Pt states he had difficulty sleeping last night, but documented sleep time is 7 hours.  Pt states he drank too much caffeinated beverages yesterday.  We discussed not consuming any caffeine 4- 6 hrs before bedtime.  Blood pressure is elevated today.  Repeat blood pressure is improved.     Principal Problem: Schizophrenia (HCC) Diagnosis: Principal Problem:   Schizophrenia (HCC)  Total Time spent with patient, talking with his mom, discussing pt with his treatment team, and coordinating care: 30 minutes   Past Psychiatric History: pt has  Diagnosis of schizophrenia (diagnosed ~2013).  He has been hospitalized in a psychiatric facility before.  He has been treated with zyprexa in the past and has done very well on the medication.  He has seen Dr. Maryruth Bun and Dr. Mare Ferrari in outpt, but has not seen either of them in a while.  He has a hx of medication noncompliance and self-injury.  Pt denies of violence towards others.  He denies hx of suicide attempts.      Past Medical History:  Past Medical History:  Diagnosis Date  . Seasonal affective disorder (HCC)    History reviewed. No pertinent surgical history. Family History: History reviewed. No pertinent family history. Family Psychiatric  History: none reported; pt denies family hx of suicide or suicide attempts Social History:  Social History   Substance and  Sexual Activity  Alcohol Use Never  . Frequency: Never     Social History   Substance and Sexual Activity  Drug Use Never    Social History   Socioeconomic History  . Marital status: Single    Spouse name: Not on file  . Number of children: Not on file  . Years of education: Not on file  . Highest education level: Not on file  Occupational History  . Not on file  Social Needs  . Financial resource strain: Not on file  . Food insecurity:    Worry: Not on file    Inability: Not on file  . Transportation needs:    Medical: Not on file    Non-medical: Not on file  Tobacco Use  . Smoking status: Never Smoker  . Smokeless tobacco: Never Used  Substance and Sexual Activity  . Alcohol use: Never    Frequency: Never  . Drug use: Never  . Sexual activity: Not on file  Lifestyle  . Physical activity:    Days per week: Not on file    Minutes per session: Not on file  . Stress: Not on file  Relationships  . Social connections:    Talks on phone: Not on file    Gets together: Not on file    Attends religious service: Not on file    Active member of club or organization: Not on file    Attends meetings of clubs or organizations: Not on file  Relationship status: Not on file  Other Topics Concern  . Not on file  Social History Narrative  . Not on file   Additional Social History:  [place] lives in [place] with his parents.  He was attending [employer] when he had his first psychotic break.  He was unable to graduate from college.  He's worked at [employer] in the past.  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  He denies being in a relationship, but identifies as heterosexual.  He plays video games for fun.  He reports that he's [place] and [place].  No hx of abuse reported.   Sleep: Fair  Appetite:  Good  Current Medications: Current Facility-Administered Medications  Medication Dose Route Frequency Provider Last Rate Last Dose  .  acetaminophen (TYLENOL) tablet 650 mg  650 mg Oral Q6H PRN Clapacs, John T, MD      . alum & mag hydroxide-simeth (MAALOX/MYLANTA) 200-200-20 MG/5ML suspension 30 mL  30 mL Oral Q4H PRN Clapacs, John T, MD      . cholecalciferol (VITAMIN D3) tablet 1,000 Units  1,000 Units Oral Q lunch Hessie Knows'Neal, Fumi Guadron, MD   1,000 Units at 11/29/18 1528  . hydrOXYzine (ATARAX/VISTARIL) tablet 25 mg  25 mg Oral TID PRN Clapacs, Jackquline DenmarkJohn T, MD   25 mg at 11/26/18 2250  . LORazepam (ATIVAN) injection 0.5 mg  0.5 mg Intramuscular Q6H PRN Clapacs, John T, MD      . magnesium hydroxide (MILK OF MAGNESIA) suspension 30 mL  30 mL Oral Daily PRN Clapacs, John T, MD      . OLANZapine (ZYPREXA) tablet 10 mg  10 mg Oral QHS Clapacs, Jackquline DenmarkJohn T, MD   10 mg at 11/28/18 2154  . polymixin-bacitracin (POLYSPORIN) ointment   Topical BID PRN Hessie Knows'Neal, Ether Wolters, MD      . traZODone (DESYREL) tablet 100 mg  100 mg Oral QHS PRN Clapacs, Jackquline DenmarkJohn T, MD   100 mg at 11/28/18 2154    Lab Results:  No results found for this or any previous visit (from the past 48 hour(s)).  Blood Alcohol level:  Lab Results  Component Value Date   ETH <10 11/25/2018    Metabolic Disorder Labs: Lab Results  Component Value Date   HGBA1C 5.3 11/25/2018   MPG 105.41 11/25/2018   No results found for: PROLACTIN Lab Results  Component Value Date   CHOL 193 11/25/2018   TRIG 63 11/25/2018   HDL 48 11/25/2018   CHOLHDL 4.0 11/25/2018   VLDL 13 11/25/2018   LDLCALC 132 (H) 11/25/2018   LDLCALC 119 (H) 12/07/2012    Physical Findings: AIMS: Facial and Oral Movements Muscles of Facial Expression: None, normal Lips and Perioral Area: None, normal Jaw: None, normal Tongue: None, normal,Extremity Movements Upper (arms, wrists, hands, fingers): None, normal Lower (legs, knees, ankles, toes): None, normal, Trunk Movements Neck, shoulders, hips: None, normal, Overall Severity Severity of abnormal movements (highest score from questions above): None,  normal Incapacitation due to abnormal movements: None, normal Patient's awareness of abnormal movements (rate only patient's report): No Awareness, Dental Status Current problems with teeth and/or dentures?: No Does patient usually wear dentures?: No  CIWA:    COWS:     Musculoskeletal: Strength & Muscle Tone: within normal limits Gait & Station: normal Patient leans: N/A  Psychiatric Specialty Exam: Physical Exam  Nursing note and vitals reviewed. Respiratory: Effort normal.    Review of Systems  Psychiatric/Behavioral: Negative for suicidal ideas.    Blood pressure Marland Kitchen(!)  143/92, pulse 90, temperature 97.6 F (36.4 C), temperature source Oral, resp. rate 16, height 5\' 11"  (1.803 m), weight 68 kg, SpO2 100 %.Body mass index is 20.92 kg/m.  General Appearance: Fairly Groomed  Eye Contact:  Fair  Speech:  Clear and Coherent  Volume:  Normal  Mood:  "good"  Affect:  Flat, but brightens upon approach  Thought Process:  Linear  Orientation:  Full (Time, Place, and Person)  Thought Content:  Logical  Suicidal Thoughts:  patient denies  Homicidal Thoughts:  patient denies  Memory:  intact  Judgement:  Fair  Insight:  Fair  Psychomotor Activity:  mildly delayed  Concentration:  Concentration: Fair and Attention Span: Fair  Recall:  [employer] of Knowledge:  Fair  Language:  Fair  Akathisia:  No  AIMS (if indicated):     Assets:  Communication Skills Desire for Improvement Physical Health Resilience Social Support  ADL's:  Intact  Cognition:  WNL  Sleep:  Number of Hours: 7     Treatment Plan Summary: Daily contact with patient to assess and evaluate symptoms and progress in treatment, Medication management and Plan see below   Pt is a 27 yo male with schizophrenia who presents after superficially scratching his neck after being frustrated about not getting the hours he wanted at work.  Pt denies that it was a suicide attempt, but admits to prior self-injurious  behaviors as well.  He states he tends to get angry or frustrated easily.  Pt also has hx of paranoia and appears to respond to internal stimuli at times; today he appeared less internally preoccupied.  He was observed interacting well with his peers and staff. Pt consented to restart zyprexa, which he has done well on in the past.   Risks (including but not limited NMS, death) and benefits were discussed and the pt voiced understanding and consented to the current medications and his treatment plan.    Pt is improving; no longer having AH.  Doing well on zyprexa.    Schizophrenia -zyprexa 10 mg qhs -QTc 409 Vit B12 low normal at 339  Abnormal TSH (elevated at 5.2) -repeat TSH was elevated at 4.930; free T4 was 0.83 (low end of normal)--follow up with PCP  Vit D deficiency -started daily Vit D   Superficial scratch on front of pt's neck -antibiotic ointment prn  Blood pressure, elevated on 11/29/18 -improved after re-check -continue to monitor.   Observation Level/Precautions:  15 minute checks  Laboratory:  see lab orders  Psychotherapy:  group  Medications:  See mar  Consultations:  N/A  Discharge Concerns: will need outpt psychiatrist at discharge - likely [facility]; will also need PCP to follow up with abnormal TSH level.   Tentative Discharge date: 12/16  Other:  Pt has hx of med noncompliance so psychoeducation will need to be an ongoing discussion     Hessie Knows, MD 11/29/2018, 6:00 PM

## 2018-11-30 DIAGNOSIS — F203 Undifferentiated schizophrenia: Secondary | ICD-10-CM

## 2018-11-30 NOTE — Progress Notes (Signed)
[facility] MD Progress Note  11/30/2018 2:41 PM [name]  MRN:  [id number] Subjective: Follow-up for this young man with schizophrenia.  Patient seen chart reviewed.  Patient says he is feeling better.  Not feeling as depressed.  Not as anxious.  Denies any paranoid ideation.  Denies any anger.  Denies any thoughts of hurting himself.  Appears to be cooperating with medication.  He is still flat and blunted and has negative symptoms of schizophrenia but is taking care of his hygiene better and getting out of his room and interacting appropriately. Principal Problem: Schizophrenia (HCC) Diagnosis: Principal Problem:   Schizophrenia (HCC)  Total Time spent with patient: 20 minutes  Past Psychiatric History: Past history of chronic psychotic disorder but with a history of chronic noncompliance leading to poor chronic functioning  Past Medical History:  Past Medical History:  Diagnosis Date  . Seasonal affective disorder (HCC)    History reviewed. No pertinent surgical history. Family History: History reviewed. No pertinent family history. Family Psychiatric  History: See previous note Social History:  Social History   Substance and Sexual Activity  Alcohol Use Never  . Frequency: Never     Social History   Substance and Sexual Activity  Drug Use Never    Social History   Socioeconomic History  . Marital status: Single    Spouse name: Not on file  . Number of children: Not on file  . Years of education: Not on file  . Highest education level: Not on file  Occupational History  . Not on file  Social Needs  . Financial resource strain: Not on file  . Food insecurity:    Worry: Not on file    Inability: Not on file  . Transportation needs:    Medical: Not on file    Non-medical: Not on file  Tobacco Use  . Smoking status: Never Smoker  . Smokeless tobacco: Never Used  Substance and Sexual Activity  . Alcohol use: Never    Frequency: Never  . Drug use: Never  .  Sexual activity: Not on file  Lifestyle  . Physical activity:    Days per week: Not on file    Minutes per session: Not on file  . Stress: Not on file  Relationships  . Social connections:    Talks on phone: Not on file    Gets together: Not on file    Attends religious service: Not on file    Active member of club or organization: Not on file    Attends meetings of clubs or organizations: Not on file    Relationship status: Not on file  Other Topics Concern  . Not on file  Social History Narrative  . Not on file   Additional Social History:                         Sleep: Fair  Appetite:  Fair  Current Medications: Current Facility-Administered Medications  Medication Dose Route Frequency Provider Last Rate Last Dose  . acetaminophen (TYLENOL) tablet 650 mg  650 mg Oral Q6H PRN Janeen Watson T, MD      . alum & mag hydroxide-simeth (MAALOX/MYLANTA) 200-200-20 MG/5ML suspension 30 mL  30 mL Oral Q4H PRN Rudi Bunyard T, MD      . cholecalciferol (VITAMIN D3) tablet 1,000 Units  1,000 Units Oral Q lunch Hessie Knows, MD   1,000 Units at 11/30/18 1223  . hydrOXYzine (ATARAX/VISTARIL) tablet 25 mg  25  mg Oral TID PRN Kenzlei Runions, Jackquline DenmarkJohn T, MD   25 mg at 11/26/18 2250  . LORazepam (ATIVAN) injection 0.5 mg  0.5 mg Intramuscular Q6H PRN Khalessi Blough T, MD      . magnesium hydroxide (MILK OF MAGNESIA) suspension 30 mL  30 mL Oral Daily PRN Rosellen Lichtenberger T, MD      . OLANZapine (ZYPREXA) tablet 10 mg  10 mg Oral QHS Neftaly Inzunza, Jackquline DenmarkJohn T, MD   10 mg at 11/29/18 2106  . polymixin-bacitracin (POLYSPORIN) ointment   Topical BID PRN Hessie Knows'Neal, Sarita, MD      . traZODone (DESYREL) tablet 100 mg  100 mg Oral QHS PRN Verniece Encarnacion, Jackquline DenmarkJohn T, MD   100 mg at 11/29/18 2106    Lab Results: No results found for this or any previous visit (from the past 48 hour(s)).  Blood Alcohol level:  Lab Results  Component Value Date   ETH <10 11/25/2018    Metabolic Disorder Labs: Lab Results  Component  Value Date   HGBA1C 5.3 11/25/2018   MPG 105.41 11/25/2018   No results found for: PROLACTIN Lab Results  Component Value Date   CHOL 193 11/25/2018   TRIG 63 11/25/2018   HDL 48 11/25/2018   CHOLHDL 4.0 11/25/2018   VLDL 13 11/25/2018   LDLCALC 132 (H) 11/25/2018   LDLCALC 119 (H) 12/07/2012    Physical Findings: AIMS: Facial and Oral Movements Muscles of Facial Expression: None, normal Lips and Perioral Area: None, normal Jaw: None, normal Tongue: None, normal,Extremity Movements Upper (arms, wrists, hands, fingers): None, normal Lower (legs, knees, ankles, toes): None, normal, Trunk Movements Neck, shoulders, hips: None, normal, Overall Severity Severity of abnormal movements (highest score from questions above): None, normal Incapacitation due to abnormal movements: None, normal Patient's awareness of abnormal movements (rate only patient's report): No Awareness, Dental Status Current problems with teeth and/or dentures?: No Does patient usually wear dentures?: No  CIWA:    COWS:     Musculoskeletal: Strength & Muscle Tone: within normal limits Gait & Station: normal Patient leans: N/A  Psychiatric Specialty Exam: Physical Exam  Nursing note and vitals reviewed. Constitutional: He appears well-developed and well-nourished.  HENT:  Head: Normocephalic and atraumatic.  Eyes: Pupils are equal, round, and reactive to light. Conjunctivae are normal.  Neck: Normal range of motion.  Cardiovascular: Regular rhythm and normal heart sounds.  Respiratory: Effort normal. No respiratory distress.  GI: Soft.  Musculoskeletal: Normal range of motion.  Neurological: He is alert.  Skin: Skin is warm and dry.  Psychiatric: Judgment normal. His affect is blunt. His speech is delayed. He is slowed. Cognition and memory are impaired. He expresses no homicidal and no suicidal ideation.    Review of Systems  Constitutional: Negative.   HENT: Negative.   Eyes: Negative.    Respiratory: Negative.   Cardiovascular: Negative.   Gastrointestinal: Negative.   Musculoskeletal: Negative.   Skin: Negative.   Neurological: Negative.   Psychiatric/Behavioral: Negative for depression, hallucinations, memory loss, substance abuse and suicidal ideas. The patient is not nervous/anxious and does not have insomnia.     Blood pressure (!) 147/93, pulse 96, temperature 98.6 F (37 C), temperature source Oral, resp. rate 18, height 5\' 11"  (1.803 m), weight 68 kg, SpO2 100 %.Body mass index is 20.92 kg/m.  General Appearance: Casual  Eye Contact:  Fair  Speech:  Slow  Volume:  Decreased  Mood:  Euthymic  Affect:  Constricted  Thought Process:  Goal Directed  Orientation:  Full (Time,  Place, and Person)  Thought Content:  Logical  Suicidal Thoughts:  No  Homicidal Thoughts:  No  Memory:  Immediate;   Fair Recent;   Fair Remote;   Fair  Judgement:  Fair  Insight:  Fair  Psychomotor Activity:  Decreased  Concentration:  Concentration: Fair  Recall:  [employer] of Knowledge:  Fair  Language:  Fair  Akathisia:  No  Handed:  Right  AIMS (if indicated):     Assets:  Desire for Improvement Physical Health Social Support  ADL's:  Intact  Cognition:  Impaired,  Mild  Sleep:  Number of Hours: 7.45     Treatment Plan Summary: Daily contact with patient to assess and evaluate symptoms and progress in treatment, Medication management and Plan Patient seems to be improved.  Still showing negative symptoms of schizophrenia but not agitated not psychotic no signs of self injury.  Compliant at least so far with medication.  No change to treatment plan for today.  Mordecai Rasmussen, MD 11/30/2018, 2:41 PM

## 2018-11-30 NOTE — Plan of Care (Signed)
Patient has been calm in the unit, no out burst or violent behaviors , contract for safety of self and others no episodes of agitations , compliant with his nightly medications , mood is mildly depressed and affect is flat , patient denies any SI,HI,AVH at this time , appetite is good and hydrate well with juices and fluids, thought process is delayed and some periods of confusion is noted, otherwise patient is pleasant  and cooperative and maintaining safety, 15 minute rounding is in progress. No distress.    Problem: Education: Goal: Knowledge of [place] [facility] Education information/materials will improve Outcome: Progressing Goal: Emotional status will improve Outcome: Progressing Goal: Mental status will improve Outcome: Progressing Goal: Verbalization of understanding the information provided will improve Outcome: Progressing   Problem: Coping: Goal: Coping ability will improve Outcome: Progressing   Problem: Safety: Goal: Ability to disclose and discuss suicidal ideas will improve Outcome: Progressing Goal: Ability to identify and utilize support systems that promote safety will improve Outcome: Progressing

## 2018-11-30 NOTE — [facility] Group Notes (Signed)
LCSW Group Therapy Note  11/30/2018 1:15pm  Type of Therapy and Topic:  Group Therapy:  Healthy Self Image and Positive Change  Participation Level:  Active   Description of Group:  In this group, patients will compare and contrast their current "I am...." statements to the visions they identify as desirable for their lives.  Patients discuss fears and how they can make positive changes in their cognitions that will positively impact their behaviors.  Facilitator played a motivational 3-minute speech and patients were left with the task of thinking about what "I am...." statements they can start using in their lives immediately.  Therapeutic Goals: 1. Patient will state their current self-perception as expressed in an "I Am" statement 2. Patient will contrast this with their desired vision for their live 3. Patient will identify 3 fears that negatively impact their behavior 4. Patient will discuss cognitive distortions that stem from their fears 5. Patient will verbalize statements that challenge their cognitive distortions  Summary of Patient Progress:  The patient reported that she feels "a little under the weather:" The patient stated, "I am broad minded" Patient discussed his fears and how he can make positive changes in their cognitions that will positively impact his behaviors. Patient was able to discuss and process cognitive distortions that stem from his  fears. Patient actively and appropriately engaged in the group. Patient was able to provide support and validation to other group members. Patient practiced active listening when interacting with the facilitator and other group members.    Therapeutic Modalities Cognitive Behavioral Therapy Motivational Interviewing  [name]  [name], LCSW 11/30/2018 4:04 PM

## 2018-11-30 NOTE — Plan of Care (Signed)
Patient denies SI, HI and AVH. Patient alert and oriented, has been attending groups; pleasant and cooperative. Rates pain 0/10. Patient emotional status is improving as evidenced by logical and coherent thinking and logical conversations.  Patient has been interacting appropriately with staff and peers on the unit. No self harm behaviors noted. Problem: Education: Goal: Knowledge of [place] [facility] Education information/materials will improve Outcome: Progressing Goal: Emotional status will improve Outcome: Progressing Goal: Mental status will improve Outcome: Progressing Goal: Verbalization of understanding the information provided will improve Outcome: Progressing   Problem: Coping: Goal: Coping ability will improve Outcome: Progressing   Problem: Safety: Goal: Ability to disclose and discuss suicidal ideas will improve Outcome: Progressing Goal: Ability to identify and utilize support systems that promote safety will improve Outcome: Progressing

## 2018-11-30 NOTE — Plan of Care (Signed)
Patient is feeling good about him self and the care he is receiving, denies any Si.HI , AVH, patient is convinced that hr will be discharging soon,patient contract for safety of self and others  patient stated appetite is good, mood is appropriate and affect is congruent with mood, patient is sleeping long hours and maintaining safety in unite  and medication compliant, education and positive social support is provided and 15 minute safety checks in progress, no Problem: Education: Goal: Knowledge of [place] [facility] Education information/materials will improve Outcome: Progressing Goal: Emotional status will improve Outcome: Progressing Goal: Mental status will improve Outcome: Progressing Goal: Verbalization of understanding the information provided will improve Outcome: Progressing   Problem: Coping: Goal: Coping ability will improve Outcome: Progressing   Problem: Safety: Goal: Ability to disclose and discuss suicidal ideas will improve Outcome: Progressing Goal: Ability to identify and utilize support systems that promote safety will improve Outcome: Progressing   distress.

## 2018-12-01 NOTE — Progress Notes (Signed)
D - Patient was in the day room upon arrival to the unit. Patient was pleasant during assessment and medication administration. Patient presented flat but denied SI/HI/AVH, pain, anxiety and depression. Patient declined any medication for sleep. Patient was calm and cooperative and interacted appropriately on the unit with staff and peers.  A - Patient was compliant with medication administration per MD orders. Patient given education. Patient given support and encouragement. Patient verbally contracts for safety with this Clinical research associatewriter.   R - Patient being monitored Q 15 minutes for safety per unit protocol. Patient informed to let staff know if there are any problems or concerns on the unit. Patient remains safe at this time.

## 2018-12-01 NOTE — Progress Notes (Signed)
D- Patient alert and oriented. Patient presents in a pleasant mood on assessment stating that he slept good last night and had no major complaints or concerns to voice to this Clinical research associatewriter. Patient states that overall, he feels good. Patient denies SI, HI, AVH, and pain at this time. Patient also denies any signs/symptoms of depression and anxiety". Patient's goal for today is to "try to go to group".  A- Support and encouragement provided.  Routine safety checks conducted every 15 minutes.  Patient informed to notify staff with problems or concerns.  R- Patient contracts for safety at this time. Patient compliant with treatment plan. Patient receptive, calm, and cooperative. Patient interacts well with others on the unit. Patient remains safe at this time.

## 2018-12-01 NOTE — [facility] Group Notes (Signed)
LCSW Group Therapy Note 12/01/2018 1:15pm  Type of Therapy and Topic: Group Therapy: Feelings Around Returning Home & Establishing a Supportive Framework and Supporting Oneself When Supports Not Available  Participation Level: Active  Description of Group:  Patients first processed thoughts and feelings about upcoming discharge. These included fears of upcoming changes, lack of change, new living environments, judgements and expectations from others and overall stigma of mental health issues. The group then discussed the definition of a supportive framework, what that looks and feels like, and how do to discern it from an unhealthy non-supportive network. The group identified different types of supports as well as what to do when your family/friends are less than helpful or unavailable  Therapeutic Goals  1. Patient will identify one healthy supportive network that they can use at discharge. 2. Patient will identify one factor of a supportive framework and how to tell it from an unhealthy network. 3. Patient able to identify one coping skill to use when they do not have positive supports from others. 4. Patient will demonstrate ability to communicate their needs through discussion and/or role plays.  Summary of Patient Progress:  The pt reported he feels "excited, about going back home." Pt engaged during group session. As patients processed their anxiety about discharge and described healthy supports patient shared He listed his family as his main support.  Patients identified at least one self-care tool they were willing to use after discharge; yoga and meditation.   Therapeutic Modalities Cognitive Behavioral Therapy Motivational Interviewing   Roger Purdum  [name], LCSW 12/01/2018 11:48 AM

## 2018-12-01 NOTE — Plan of Care (Signed)
Patient was in a pleasant mood and said he had a good. Patient was acting appropriately on the unit with staff and peers.   Problem: Education: Goal: Emotional status will improve Outcome: Progressing Goal: Mental status will improve Outcome: Progressing

## 2018-12-01 NOTE — Progress Notes (Signed)
[facility] MD Progress Note  12/01/2018 12:33 PM [name]  MRN:  [id number] Subjective: Follow-up for this young man with schizophrenia.  Patient has no new complaints.  Behavior is calm.  Not agitated or psychotic.  Does not appear to be delusional.  Still lots of negative symptoms.  No physical complaints. Principal Problem: Schizophrenia (HCC) Diagnosis: Principal Problem:   Schizophrenia (HCC)  Total Time spent with patient: 15 minutes  Past Psychiatric History: History of recurrent episodes of schizophrenia and noncompliance  Past Medical History:  Past Medical History:  Diagnosis Date  . Seasonal affective disorder (HCC)    History reviewed. No pertinent surgical history. Family History: History reviewed. No pertinent family history. Family Psychiatric  History: See previous note Social History:  Social History   Substance and Sexual Activity  Alcohol Use Never  . Frequency: Never     Social History   Substance and Sexual Activity  Drug Use Never    Social History   Socioeconomic History  . Marital status: Single    Spouse name: Not on file  . Number of children: Not on file  . Years of education: Not on file  . Highest education level: Not on file  Occupational History  . Not on file  Social Needs  . Financial resource strain: Not on file  . Food insecurity:    Worry: Not on file    Inability: Not on file  . Transportation needs:    Medical: Not on file    Non-medical: Not on file  Tobacco Use  . Smoking status: Never Smoker  . Smokeless tobacco: Never Used  Substance and Sexual Activity  . Alcohol use: Never    Frequency: Never  . Drug use: Never  . Sexual activity: Not on file  Lifestyle  . Physical activity:    Days per week: Not on file    Minutes per session: Not on file  . Stress: Not on file  Relationships  . Social connections:    Talks on phone: Not on file    Gets together: Not on file    Attends religious service: Not on file     Active member of club or organization: Not on file    Attends meetings of clubs or organizations: Not on file    Relationship status: Not on file  Other Topics Concern  . Not on file  Social History Narrative  . Not on file   Additional Social History:                         Sleep: Fair  Appetite:  Fair  Current Medications: Current Facility-Administered Medications  Medication Dose Route Frequency Provider Last Rate Last Dose  . acetaminophen (TYLENOL) tablet 650 mg  650 mg Oral Q6H PRN Roby Donaway T, MD      . alum & mag hydroxide-simeth (MAALOX/MYLANTA) 200-200-20 MG/5ML suspension 30 mL  30 mL Oral Q4H PRN Terree Gaultney T, MD      . cholecalciferol (VITAMIN D3) tablet 1,000 Units  1,000 Units Oral Q lunch Hessie Knows, MD   1,000 Units at 12/01/18 1115  . hydrOXYzine (ATARAX/VISTARIL) tablet 25 mg  25 mg Oral TID PRN Ravleen Ries, Jackquline Denmark, MD   25 mg at 11/26/18 2250  . LORazepam (ATIVAN) injection 0.5 mg  0.5 mg Intramuscular Q6H PRN Timi Reeser T, MD      . magnesium hydroxide (MILK OF MAGNESIA) suspension 30 mL  30 mL Oral Daily  PRN Zakhai Meisinger, Jackquline DenmarkJohn T, MD      . OLANZapine [facility](ZYPREXA) tablet 10 mg  10 mg Oral QHS Ashe Graybeal, Jackquline DenmarkJohn T, MD   10 mg at 11/30/18 2144  . polymixin-bacitracin (POLYSPORIN) ointment   Topical BID PRN Hessie Knows'Neal, Sarita, MD      . traZODone (DESYREL) tablet 100 mg  100 mg Oral QHS PRN Swannie Milius, Jackquline DenmarkJohn T, MD   100 mg at 11/30/18 2144    Lab Results: No results found for this or any previous visit (from the past 48 hour(s)).  Blood Alcohol level:  Lab Results  Component Value Date   ETH <10 11/25/2018    Metabolic Disorder Labs: Lab Results  Component Value Date   HGBA1C 5.3 11/25/2018   MPG 105.41 11/25/2018   No results found for: PROLACTIN Lab Results  Component Value Date   CHOL 193 11/25/2018   TRIG 63 11/25/2018   HDL 48 11/25/2018   CHOLHDL 4.0 11/25/2018   VLDL 13 11/25/2018   LDLCALC 132 (H) 11/25/2018   LDLCALC 119 (H)  12/07/2012    Physical Findings: AIMS: Facial and Oral Movements Muscles of Facial Expression: None, normal Lips and Perioral Area: None, normal Jaw: None, normal Tongue: None, normal,Extremity Movements Upper (arms, wrists, hands, fingers): None, normal Lower (legs, knees, ankles, toes): None, normal, Trunk Movements Neck, shoulders, hips: None, normal, Overall Severity Severity of abnormal movements (highest score from questions above): None, normal Incapacitation due to abnormal movements: None, normal Patient's awareness of abnormal movements (rate only patient's report): No Awareness, Dental Status Current problems with teeth and/or dentures?: No Does patient usually wear dentures?: No  CIWA:    COWS:     Musculoskeletal: Strength & Muscle Tone: within normal limits Gait & Station: normal Patient leans: N/A  Psychiatric Specialty Exam: Physical Exam  Nursing note and vitals reviewed. Constitutional: He appears well-developed and well-nourished.  HENT:  Head: Normocephalic and atraumatic.  Eyes: Pupils are equal, round, and reactive to light. Conjunctivae are normal.  Neck: Normal range of motion.  Cardiovascular: Normal heart sounds.  Respiratory: Effort normal.  GI: Soft.  Musculoskeletal: Normal range of motion.  Neurological: He is alert.  Skin: Skin is warm and dry.  Psychiatric: He has a normal mood and affect. His behavior is normal. Judgment and thought content normal.    Review of Systems  Constitutional: Negative.   HENT: Negative.   Eyes: Negative.   Respiratory: Negative.   Cardiovascular: Negative.   Gastrointestinal: Negative.   Musculoskeletal: Negative.   Skin: Negative.   Neurological: Negative.   Psychiatric/Behavioral: Negative.     Blood pressure (!) 143/97, pulse 73, temperature 98.6 F (37 C), temperature source Oral, resp. rate 18, height 5\' 11"  (1.803 m), weight 68 kg, SpO2 100 %.Body mass index is 20.92 kg/m.  General Appearance:  Fairly Groomed  Eye Contact:  Minimal  Speech:  Slow  Volume:  Decreased  Mood:  Euthymic  Affect:  Constricted  Thought Process:  Coherent  Orientation:  Full (Time, Place, and Person)  Thought Content:  Logical  Suicidal Thoughts:  No  Homicidal Thoughts:  No  Memory:  Immediate;   Fair Recent;   Fair Remote;   Fair  Judgement:  Fair  Insight:  Fair  Psychomotor Activity:  Decreased  Concentration:  Concentration: Fair  Recall:  FiservFair  Fund of Knowledge:  Fair  Language:  Fair  Akathisia:  No  Handed:  Right  AIMS (if indicated):     Assets:  Housing Physical Health  ADL's:  Intact  Cognition:  Impaired,  Mild  Sleep:  Number of Hours: 7.45     Treatment Plan Summary: Daily contact with patient to assess and evaluate symptoms and progress in treatment, Medication management and Plan Supportive counseling psychoeducation and review of medicine.  No change treatment plan continue current medication as is.  Mordecai Rasmussen, MD 12/01/2018, 12:33 PM

## 2018-12-01 NOTE — Plan of Care (Signed)
Patient verbalizes understanding of the general information that's been provided to him and has not voiced any further questions or concerns at this time. Patient denies SI/HI/AVH as well as any depression and anxiety. Patient has been present in the milieu majority of the day and has attended/participated in unit groups without any issues at this time. Patient remains safe on the unit at this time.  Problem: Education: Goal: Knowledge of [facility] Education information/materials will improve Outcome: Progressing Goal: Emotional status will improve Outcome: Progressing Goal: Mental status will improve Outcome: Progressing Goal: Verbalization of understanding the information provided will improve Outcome: Progressing   Problem: Coping: Goal: Coping ability will improve Outcome: Progressing   Problem: Safety: Goal: Ability to disclose and discuss suicidal ideas will improve Outcome: Progressing Goal: Ability to identify and utilize support systems that promote safety will improve Outcome: Progressing

## 2018-12-02 MED ORDER — TRAZODONE HCL 100 MG PO TABS: 100.0000 mg | ORAL_TABLET | Freq: Every evening | ORAL | 0 refills | Status: DC | PRN

## 2018-12-02 MED ORDER — TRAZODONE HCL 100 MG PO TABS: 100.0000 mg | ORAL_TABLET | Freq: Every evening | ORAL | 1 refills | Status: AC | PRN

## 2018-12-02 MED ORDER — OLANZAPINE 10 MG PO TABS: 10.0000 mg | ORAL_TABLET | Freq: Every day | ORAL | 1 refills | Status: AC

## 2018-12-02 MED ORDER — VITAMIN D3 25 MCG (1000 UNIT) PO TABS: 1000.0000 [IU] | ORAL_TABLET | Freq: Every day | ORAL | 1 refills | Status: AC

## 2018-12-02 MED ORDER — VITAMIN D3 25 MCG (1000 UNIT) PO TABS: 1000.0000 [IU] | ORAL_TABLET | Freq: Every day | ORAL | 0 refills | Status: DC

## 2018-12-02 MED ORDER — OLANZAPINE 10 MG PO TABS: 10.0000 mg | ORAL_TABLET | Freq: Every day | ORAL | 0 refills | Status: DC

## 2018-12-02 NOTE — [facility] Suicide Risk Assessment (Addendum)
[name]   Principal Problem: Schizophrenia [facility](HCC) Discharge Diagnoses: Principal Problem:   Schizophrenia (HCC)   Total Time spent with patient, reviewing chart, discussing plan of care with the patient and his treatment team, preparing discharge: 35 min  Musculoskeletal: Strength & Muscle Tone: within normal limits Gait & Station: normal Patient leans: N/A  Psychiatric Specialty Exam: Review of Systems  Constitutional: Negative for chills and fever.  HENT: Negative for sore throat.   Eyes: Negative for pain.  Respiratory: Negative for shortness of breath.   Cardiovascular: Negative for chest pain.  Gastrointestinal: Negative for abdominal pain, constipation, diarrhea, heartburn, nausea and vomiting.  Genitourinary: Negative for dysuria.  Musculoskeletal: Negative for myalgias.  Neurological: Negative for dizziness, seizures, loss of consciousness and weakness.  Psychiatric/Behavioral: Negative for depression, hallucinations, substance abuse and suicidal ideas. The patient is not nervous/anxious and does not have insomnia.     Blood pressure (!) 151/99, pulse 76, temperature 98.6 F (37 C), temperature source Oral, resp. rate 18, height 5\' 11"  (1.803 Jimenez), weight 68 kg, SpO2 100 %.Body mass index is 20.92 kg/Jimenez.  General Appearance: Casual  Eye Contact::  Good  Speech:  Clear and Coherent  Volume:  Normal  Mood:  "pretty good"  Affect:  blunted, but brightens somewhat upon approach  Thought Process:  Coherent and Linear  Orientation:  Full (Time, Place, and Person)  Thought Content:  Logical  Suicidal Thoughts:  patient denies  Homicidal Thoughts:  patient denies  Memory:  intact  Judgement:  Fair  Insight:  Fair  Psychomotor Activity:  Normal  Concentration:  Good  Recall:  Good  Fund of Knowledge:Good  Language: Good  Akathisia:  No  AIMS (if indicated):   0  Assets:  Communication Skills Desire for Improvement Housing Physical  Health Resilience Social Support  Sleep:  Number of Hours: 6.15  Cognition: WNL  ADL's:  Intact   Mental Status Per Nursing Assessment::   On Admission:  NA(Denies)  Demographic Factors:  Male and lives with parents, employed  Loss Factors: Decrease in vocational status and Financial problems/change in socioeconomic status  Historical Factors: Impulsivity  Risk Reduction Factors:   Employed, Living with another person, especially a relative, Positive social support and Positive therapeutic relationship  Continued Clinical Symptoms:  Previous Psychiatric Diagnoses and Treatments Medical Diagnoses and Treatments/Surgeries  Schizophrenia diagnosis  Cognitive Features That Contribute To Risk:  Negative features of Schizophrenia (blunting of affect, poverty of speech, reduced emotion)  Suicide and violence risk:  Risk factors: hx of self-injurious behaviors; hx of mood dysregulation and paranoia related to his diagnosis of schizophrenia, hx of agitation/irritablity; hx of med noncompliance Protective factors: denial of active suicidality, denial of hx of suicide attempts, denial of homicidal ideation; social support from parents, employment, desire to work more, desire for independence, goal to move out of parents' home, willingness to seek help, motivated to continue and increase therapeutic services at mental health outpatient clinic ([facility]), denial of harm to others, denial of access to or ownership of firearms.   Acute suicide risk: low as pt is currently denying suicidal ideation, denies a history of suicide attempts and is future/goal-oriented.  Chronic suicide risk: elevated in the context of diagnosis of schizophrenia, history of impulsive behaviors, history of medication noncompliance (however, the risk is mitigated by adherence to treatment).  Acute violence risk: low  Crisis Plan/Safety Plan: call 911, go to the Emergency Department/local hospital, talk to family/friends, talk  to therapist/counselor/doctor, call crisis hotlines.  Follow-up Information    [employer] Go on 12/04/2018.   Why:  Please meet Unk Pinto, Peer Support, on Wed, 12/04/18, at 7:15am for your hospital discharge appt. Contact information: [address] [place] [address] [phone]           Plan Of Care/Follow-up recommendations:  Activity:  as tolerated Diet:  low sodium and heart healthy Tests:  Follow up with Primary Care Provider for blood pressure and abnormal thyroid lab results.   Other:  Continue daily Vitamin D supplement and follow up with primary care provider.   Results for YESHAYA, VATH (MRN [id number]) as of 12/02/2018 09:22  Ref. Range 11/25/2018 00:50 11/25/2018 17:27 11/25/2018 17:27 11/27/2018 07:27  TSH Latest Ref Range: 0.350 - 4.500 uIU/mL 5.200 (H)   4.930 (H)  Triiodothyronine,Free,Serum Latest Ref Range: 2.0 - 4.4 pg/mL    3.2  T4,Free(Direct) Latest Ref Range: 0.82 - 1.77 ng/dL    4.13   Results for ERYX, ZANE (MRN [id number]) as of 12/02/2018 09:22  Ref. Range 11/27/2018 07:27  Vitamin D, 25-Hydroxy Latest Ref Range: 30.0 - 100.0 ng/mL 24.3 (L)  Vitamin B12 Latest Ref Range: 180 - 914 pg/mL 339   Hessie Knows, MD 12/02/2018, 9:11 AM

## 2018-12-02 NOTE — [facility] Group Notes (Signed)
[facility] Group Notes:  (Nursing/MHT/Case Management/Adjunct)  Date:  12/02/2018  Time:  3:37 PM  Type of Therapy:  Psychoeducational Skills  Participation Level:  Did Not Attend   Lynelle SmokeCara Travis [facility] GwinnettMadoni 12/02/2018, 3:37 PM

## 2018-12-02 NOTE — Progress Notes (Signed)
Recreation Therapy Notes  Date: 12/02/2018  Time: 9:30 am  Location: Craft Room  Behavioral response: Appropriate  Intervention Topic: Problem Solving  Discussion/Intervention:  Group content on today was focused on problem solving. The group described what problem solving is. Patients expressed how problems affect them and how they deal with problems. Individuals identified healthy ways to deal with problems. Patients explained what normally happens to them when they do not deal with problems. The group expressed reoccurring problems for them. The group participated in the intervention "Ways to Solve problems" where patients were given a chance to explore different ways to solve problems.  Clinical Observations/Feedback:  Patient came to group late due to unknown reasons. Individual was social with peers and staff while participating in the intervention.  [name] LRT/CTRS         [name] 12/02/2018 10:23 AM

## 2018-12-02 NOTE — Progress Notes (Signed)
D- Patient alert and oriented. Patient presents in a pleasant mood on assessment and had no major complaints or concerns to voice to this Clinical research associatewriter. Patient denies symptoms of depression and anxiety.  Patient also denies SI, HI, AVH, and pain at this time. Patient had no stated goals for today.  A- Scheduled medications administered to patient, per MD orders. Support and encouragement provided.  Routine safety checks conducted every 15 minutes.  Patient informed to notify staff with problems or concerns.  R- No adverse drug reactions noted. Patient contracts for safety at this time. Patient compliant with medications and treatment plan. Patient receptive, calm, and cooperative. Patient interacts well with others on the unit.  Patient remains safe at this time.

## 2018-12-02 NOTE — Progress Notes (Signed)
Patient discharged to private residence with his mother and father at 8:10 pm. Patient given all belongings, paperwork and medication supply. Patient given follow-up instructions and education along with education on medications. Patient was in good spirits upon discharging to his parents and stated, "I am thankful for my time here, everyone was very helpful."

## 2018-12-02 NOTE — Progress Notes (Signed)
Recreation Therapy Notes  INPATIENT RECREATION TR PLAN  Patient Details Name: [name] MRN: [id number] DOB: [birth year] Today's Date: 12/02/2018  Rec Therapy Plan Is patient appropriate for Therapeutic Recreation?: Yes Treatment times per week: at least 3 Estimated Length of Stay: 5-7 Days TR Treatment/Interventions: Group participation (Comment)  Discharge Criteria Pt will be discharged from therapy if:: Discharged Treatment plan/goals/alternatives discussed and agreed upon by:: Patient/family  Discharge Summary Short term goals set: Patient will identify 3 positive coping skills strategies to use post d/c within 5 recreation therapy group sessions Short term goals met: Complete Progress toward goals comments: Groups attended Which groups?: Stress management, Coping skills, Other (Comment)(Problem Solving, Emotions, Happiness) Reason goals not met: N/A Therapeutic equipment acquired: N/A Reason patient discharged from therapy: Discharge from hospital Pt/family agrees with progress & goals achieved: Yes Date patient discharged from therapy: 12/02/18   [name] 12/02/2018, 2:05 PM

## 2018-12-02 NOTE — [facility] Group Notes (Signed)
[facility] LCSW Group Therapy Note  Date/Time: 11/22/18, 1300  Type of Therapy and Topic:  Group Therapy:  Overcoming Obstacles  Participation Level:  Did not attend  Description of Group:    In this group patients will be encouraged to explore what they see as obstacles to their own wellness and recovery. They will be guided to discuss their thoughts, feelings, and behaviors related to these obstacles. The group will process together ways to cope with barriers, with attention given to specific choices patients can make. Each patient will be challenged to identify changes they are motivated to make in order to overcome their obstacles. This group will be process-oriented, with patients participating in exploration of their own experiences as well as giving and receiving support and challenge from other group members.  Therapeutic Goals: 1. Patient will identify personal and current obstacles as they relate to admission. 2. Patient will identify barriers that currently interfere with their wellness or overcoming obstacles.  3. Patient will identify feelings, thought process and behaviors related to these barriers. 4. Patient will identify two changes they are willing to make to overcome these obstacles:    Summary of Patient Progress      Therapeutic Modalities:   Cognitive Behavioral Therapy Solution Focused Therapy Motivational Interviewing Relapse Prevention Therapy  Daleen SquibbGreg Mearl Harewood, LCSW

## 2018-12-02 NOTE — Discharge Summary (Signed)
Physician Discharge Summary Note  Patient:  [name] is an 27 y.o., male MRN:  [id number] DOB:  Jan 30, 1991 Patient phone:  [phone] (home)  Patient address:   [address] [place] [address],   Total Time spent with patient, reviewing chart, discussing plan of care with the patient and his treatment team, preparing discharge: 35 min  Date of Admission:  11/25/2018 Date of Discharge: 12/03/2018  Reason for Admission:   History of Present Illness: [name] is a 27 yo male with schizophrenia and history of medication noncompliance who was admitted after scratching his neck superficially with the end of his glasses.  Pt states the reason he scratched himself was b/c he was feeling frustrated about not receiving more hours at his current job.  Pt denies that it was a suicide attempt.  Pt works in the [employer] at [employer].  Pt states that working keeps his mind occupied and allows him to be around peers his age.  When he's not working, he's playing video games.  Pt states he feels isolated at times and bored.  Pt admits to hx of anger outbursts leading to self-injurious behaviors.  For example 1 month ago, pt became upset over something and used a piece of wood to nick his right pointer finger.  Pt shows me the small scar on his finger.  Pt has been observed by staff on the unit talking to self and having a conversation with no one there.  However, pt denies AH, VH, SI, HI.  Pt provides verbal and written consent for me to speak to his mother, [name] [phone]. She states that the pt was diagnosed with schizophrenia in ~2013.  He did well on Zyprexa for a few months and then pt stopped taking it on his own.  Mom states during the time the pt was compliant with his medication, he was able work and keep his emotions under control.  Off his medication, mother has heard the pt talking to himself or having conversations with others that are  not there; she states he is paranoid.  He has stolen her cane, presumably, b/c he thinks people on the street are talking about him or may become aggressive towards him, so mom thinks he may be thinking that he needs something to protect himself with.  Additionally, pt is paranoid regarding eating in front of others.  Mom is unsure why, but pt will not eat with his family.  He will wait for family to leave the dining room or kitchen before he partakes in a meal.  Mother states his behaviors are bizarre and worrisome.  She is happy the pt is back on zyprexa, but she worries about his compliance.     Associated Signs/Symptoms: Depression Symptoms:  agitation (Hypo) Manic Symptoms:  Distractibility, irritable mood at times, impulsivity Anxiety Symptoms:  Excessive Worry, Psychotic Symptoms:  Paranoia, pt denies AH, VH, but appear to be responding to internal stimuli PTSD Symptoms: none reported  Principal Problem: Schizophrenia [facility]) Discharge Diagnoses: Principal Problem:   Schizophrenia [facility])   Past Psychiatric History: pt has  Diagnosis of schizophrenia (diagnosed ~2013).  He has been hospitalized in a psychiatric facility before.  He has been treated with zyprexa in the past and has done very well on the medication.  He has seen Dr. Maryruth Bun and Dr. Mare Ferrari in outpt, but has not seen either of them in a while.  He has a hx of medication noncompliance and self-injury.  Pt  denies of violence towards others.  He denies hx of suicide attempts.     Past Medical History:  Past Medical History:  Diagnosis Date  . Seasonal affective disorder (HCC)    History reviewed. No pertinent surgical history. Family History: History reviewed. No pertinent family history. Family Psychiatric  History: none reported; pt denies family hx of suicide or suicide attempts Social History:  Additional Social History:  Pt lives in [place] with his parents.  He was attending [employer] when he had his first  psychotic break.  He was unable to graduate from college.  He's worked at [employer] in the past.  He's currently working at a [employer].  He denies being in a relationship, but identifies as heterosexual.  He plays video games for fun.  He reports that he's [place].  No hx of abuse reported.   Social History   Substance and Sexual Activity  Alcohol Use Never  . Frequency: Never     Social History   Substance and Sexual Activity  Drug Use Never    Social History   Socioeconomic History  . Marital status: Single    Spouse name: Not on file  . Number of children: Not on file  . Years of education: Not on file  . Highest education level: Not on file  Occupational History  . Not on file  Social Needs  . Financial resource strain: Not on file  . Food insecurity:    Worry: Not on file    Inability: Not on file  . Transportation needs:    Medical: Not on file    Non-medical: Not on file  Tobacco Use  . Smoking status: Never Smoker  . Smokeless tobacco: Never Used  Substance and Sexual Activity  . Alcohol use: Never    Frequency: Never  . Drug use: Never  . Sexual activity: Not on file  Lifestyle  . Physical activity:    Days per week: Not on file    Minutes per session: Not on file  . Stress: Not on file  Relationships  . Social connections:    Talks on phone: Not on file    Gets together: Not on file    Attends religious service: Not on file    Active member of club or organization: Not on file    Attends meetings of clubs or organizations: Not on file    Relationship status: Not on file  Other Topics Concern  . Not on file  Social History Narrative  . Not on file    Hospital Course:  Pt is a 27 yo male with schizophrenia who presents after superficially scratching his neck after being frustrated about not getting the hours he wanted at work. Pt denies that it was a suicide attempt, but admits to prior self-injurious behaviors as  well. He states he tends to get angry or frustrated easily. Pt also has hx of paranoia and appears to respond to internal stimuli at times.   Pt was observed by unit staff responding to internal stimuli.  Pt consented to restart zyprexa, which he has done well on in the past. Risks (including but not limited NMS, death) and benefits were discussed and the pt voiced understanding and consented to the current medications and his treatment plan. As the pt complied with medication, he appeared less internally preoccupied.  He was observed interacting well with his peers and staff. He denied SI, HI, AH, VH and paranoia. He still  displayed some negative symptoms of schizophrenia, but he reported improvement in his mood and denied auditory hallucinations.  Psychoeducation was provided and the importance of medication was relayed to the pt.  Pt will be going to [facility] for outpt mental health services.  Peer support is recommended as pt has done well with peer support in the past.  Peer support and frequent follow up may also increase medication and treatment compliance. Pt also to [facility] management courses [facility].  Pt also needs insurance and disability.  Pt has appointment at the social security office for medicaid and disability in Jan 2020.  Pt stated that he talked with his family about going to a group home.  Pt is actually in favor of moving out of his parent's home and going to a group home in the future if necessary.    During hospitalization, pt's BP increased.  Pt states at home he eats a vegan diet, but at the hospital he's on a regular diet and has been eating meat and more sodium than he usually eats.  Recommended pt to follow up with a primary care provider for his blood pressure and to have a low sodium, heart healthy diet and see if his blood pressure reduces.  Pt denied headaches or chest pain.  Pt also had a couple of nights that his sleep was disrupted, but pt attributed poor sleep to increase in  caffeine consumption.  Pt encouraged not to consume caffeine 4-6 hours before his bedtime.  Pt's sleep improved.    Pt's blood work also revealed abnormal thyroid lab results. Pt has a family hx of thyroid disease in his mother and sister.  Pt and mother aware of his abnormal thyroid results and the recommendation to follow up with primary care provider for further evaluation and treatment.  Additionally, pt's Vit D was low, so he was started on daily supplementation.     Schizophrenia -zyprexa 10 mg qhs -QTc 409 Vit B12 low normal at 339  Abnormal TSH (elevated at 5.2) -repeat TSH was elevated at 4.930; free T4 was 0.83 (low end of normal)--follow up with PCP  Vit D deficiency -started daily Vit D   Superficial scratch on front of pt's neck -antibiotic ointment prn  Blood pressure, elevated at times, but did not start anti-hypertensive as pt denied history of blood pressure problems.  He denied headaches, chest pain.  He attributed his elevation in blood pressure to eating meat and more sodium than he usually while in the hospital.  Pt plans to return to a vegan diet at discharge.  Recommend follow up with PCP to monitor BP and further evaluate and treat if necessary.  -continue to monitor.    Suicide and violence risk:  Risk factors: hx of self-injurious behaviors; hx of mood dysregulation and paranoia related to his diagnosis of schizophrenia, hx of agitation/irritablity; hx of med noncompliance Protective factors: denial of active suicidality, denial of hx of suicide attempts, denial of homicidal ideation; social support from parents, employment, desire to work more, desire for independence, goal to move out of parents' home, willingness to seek help, motivated to continue and increase therapeutic services at mental health outpatient clinic ([facility]), denial of harm to others, denial of access to or ownership of firearms.   Acute suicide risk: low as pt is currently denying suicidal  ideation, denies a history of suicide attempts and is future/goal-oriented.  Chronic suicide risk: elevated in the context of diagnosis of schizophrenia, history of impulsive behaviors, history  of medication noncompliance (however, the risk is mitigated by adherence to treatment).  Acute violence risk: low Crisis Plan/Safety Plan: call 911, go to the Emergency Department/local hospital, talk to family/friends, talk to therapist/counselor/doctor, call crisis hotlines.    On day of discharge, pt denied SI, HI, AH, VH, paranoia.  He has been compliant with his medication.  Mood is stable.   He no longer meets criteria for psychiatric commitment and is discharged.   Physical Findings: AIMS: Facial and Oral Movements Muscles of Facial Expression: None, normal Lips and Perioral Area: None, normal Jaw: None, normal Tongue: None, normal,Extremity Movements Upper (arms, wrists, hands, fingers): None, normal Lower (legs, knees, ankles, toes): None, normal, Trunk Movements Neck, shoulders, hips: None, normal, Overall Severity Severity of abnormal movements (highest score from questions above): None, normal Incapacitation due to abnormal movements: None, normal Patient's awareness of abnormal movements (rate only patient's report): No Awareness, Dental Status Current problems with teeth and/or dentures?: No Does patient usually wear dentures?: No  CIWA:    COWS:     Musculoskeletal: Strength & Muscle Tone: within normal limits Gait & Station: normal Patient leans: N/A  Psychiatric Specialty Exam: Physical Exam  Nursing note and vitals reviewed.   ROS  Constitutional: Negative for chills and fever.  HENT: Negative for sore throat.   Eyes: Negative for pain.  Respiratory: Negative for shortness of breath.   Cardiovascular: Negative for chest pain.  Gastrointestinal: Negative for abdominal pain, constipation, diarrhea, heartburn, nausea and vomiting.  Genitourinary: Negative for dysuria.   Musculoskeletal: Negative for myalgias.  Neurological: Negative for dizziness, seizures, loss of consciousness and weakness.  Psychiatric/Behavioral: Negative for depression, hallucinations, substance abuse and suicidal ideas. The patient is not nervous/anxious and does not have insomnia.    Blood pressure (!) 151/99, pulse 76, temperature 98.6 F (37 C), temperature source Oral, resp. rate 18, height 5\' 11"  (1.803 m), weight 68 kg, SpO2 100 %.Body mass index is 20.92 kg/m.   General Appearance: Casual  Eye Contact::  Good  Speech:  Clear and Coherent  Volume:  Normal  Mood:  "pretty good"  Affect:  blunted, but brightens somewhat upon approach  Thought Process:  Coherent and Linear  Orientation:  Full (Time, Place, and Person)  Thought Content:  Logical  Suicidal Thoughts:  patient denies  Homicidal Thoughts:  patient denies  Memory:  intact  Judgement:  Fair  Insight:  Fair  Psychomotor Activity:  Normal  Concentration:  Good  Recall:  Good  Fund of Knowledge:Good  Language: Good  Akathisia:  No  AIMS (if indicated):   0  Assets:  Communication Skills Desire for Improvement Housing Physical Health Resilience Social Support  Sleep:  Number of Hours: 6.15  Cognition: WNL  ADL's:  Intact     Have you used any form of tobacco in the last 30 days? (Cigarettes, Smokeless Tobacco, Cigars, and/or Pipes): No  Has this patient used any form of tobacco in the last 30 days? (Cigarettes, Smokeless Tobacco, Cigars, and/or Pipes) Yes, No  Blood Alcohol level:  Lab Results  Component Value Date   ETH <10 11/25/2018    Metabolic Disorder Labs:  Lab Results  Component Value Date   HGBA1C 5.3 11/25/2018   MPG 105.41 11/25/2018   No results found for: PROLACTIN Lab Results  Component Value Date   CHOL 193 11/25/2018   TRIG 63 11/25/2018   HDL 48 11/25/2018   CHOLHDL 4.0 11/25/2018   VLDL 13 11/25/2018   LDLCALC 132 (H) 11/25/2018  LDLCALC 119 (H) 12/07/2012    See  Psychiatric Specialty Exam and Suicide Risk Assessment completed by Attending Physician prior to discharge.  Discharge destination:  Home  Is patient on multiple antipsychotic therapies at discharge:  No   Has Patient had three or more failed trials of antipsychotic monotherapy by history:  No  Recommended Plan for Multiple Antipsychotic Therapies: NA  Discharge Instructions    Activity as tolerated - No restrictions   Complete by:  As directed    Diet - low sodium heart healthy   Complete by:  As directed      Allergies as of 12/02/2018      Reactions   Haldol [haloperidol Lactate] Other (See Comments)   Involuntary movements   Other    Pt says he has sensitive skin-gets bacterial infections easily; has to be careful with laundry detergent and dishwasher soap      Medication List    TAKE these medications     Indication  cholecalciferol 25 MCG (1000 UT) tablet Commonly known as:  VITAMIN D Take 1 tablet (1,000 Units total) by mouth daily with lunch.  Indication:  Vit D deficicency   OLANZapine 10 MG tablet Commonly known as:  ZYPREXA Take 1 tablet (10 mg total) by mouth at bedtime.  Indication:  Schizophrenia   traZODone 100 MG tablet Commonly known as:  DESYREL Take 1 tablet (100 mg total) by mouth at bedtime as needed for sleep.  Indication:  Trouble Sleeping      Follow-up Information    [facility] Go on 12/04/2018.   Why:  Please meet [name], Peer Support, on Wed, 12/04/18, at 7:15am for your hospital discharge appt. Contact information: [address] [place] [address] [phone]           Follow-up recommendations:   Plan Of Care/Follow-up recommendations:  Activity:  as tolerated Diet:  low sodium and heart healthy Tests:  Follow up with Primary Care Provider for blood pressure and abnormal thyroid lab results.   Other:  Continue daily Vitamin D supplement and follow up with primary care provider.   Results  for [name], [name] (MRN [id number]) as of 12/02/2018 09:22  Ref. Range 11/25/2018 00:50 11/25/2018 17:27 11/25/2018 17:27 11/27/2018 07:27  TSH Latest Ref Range: 0.350 - 4.500 uIU/mL 5.200 (H)   4.930 (H)  Triiodothyronine,Free,Serum Latest Ref Range: 2.0 - 4.4 pg/mL    3.2  T4,Free(Direct) Latest Ref Range: 0.82 - 1.77 ng/dL    5.780.83   Results for [name], [name] (MRN [id number]) as of 12/02/2018 09:22  Ref. Range 11/27/2018 07:27  Vitamin D, 25-Hydroxy Latest Ref Range: 30.0 - 100.0 ng/mL 24.3 (L)  Vitamin B12 Latest Ref Range: 180 - 914 pg/mL 339     Signed: Hessie KnowsSarita O'Neal, MD 12/04/2018, 2:02 PM

## 2018-12-05 NOTE — Progress Notes (Signed)
  [facility] Discharge Plan :  Will you be returning to the same living situation after discharge:  Yes,  with parents At discharge, do you have transportation home?: Yes,  mother Do you have the ability to pay for your medications: No. Will work with [facility].  Release of information consent forms completed and in the chart;  Patient's signature needed at discharge.  Patient to Follow up at: Follow-up Information    [facility] Go on 12/04/2018.   Why:  Please meet Unk PintoHarvey Bryant, Peer Support, on Wed, 12/04/18, at 7:15am for your hospital discharge appt. Contact information: [address] [place] [address] [phone]           Next level of care provider has [facility] to [facility] Link:no  Safety Planning and Suicide Prevention discussed: Yes,  with mother  Have you used any form of tobacco in the last 30 days? (Cigarettes, Smokeless Tobacco, Cigars, and/or Pipes): No  Has patient been referred to the Quitline?: N/A patient is not a smoker  Patient has been referred for addiction treatment: N/A   CSW spoke to pt mother on 12/19 regarding primary care MD appt.  We discussed process for getting [facility] but mother then reported that pt already [facility] [place] and has an appt on 12/30/18.  They are planning to follow up there.  Lorri FrederickWierda, Tallen Schnorr Jon, LCSW 12/05/2018, 1:00 PM

## 2018-12-06 ENCOUNTER — Telehealth: Payer: Self-pay | Admitting: Pharmacy Technician

## 2018-12-06 NOTE — Telephone Encounter (Signed)
Patient presented with prescriptions. Stated that he would have health care coverage beginning 01/01/19.  Sherilyn DacostaBetty J. Kluttz Care Manager [facility]

## 2019-03-13 ENCOUNTER — Ambulatory Visit: Payer: Medicaid Other | Admitting: Pharmacy Technician

## 2019-03-13 ENCOUNTER — Other Ambulatory Visit: Payer: Self-pay

## 2019-03-13 ENCOUNTER — Encounter (INDEPENDENT_AMBULATORY_CARE_PROVIDER_SITE_OTHER): Payer: Self-pay

## 2019-03-13 DIAGNOSIS — Z79899 Other long term (current) drug therapy: Secondary | ICD-10-CM

## 2019-03-13 NOTE — Progress Notes (Signed)
Conducted phone consult.  Completed [facility]'s application.  Will mail to patient to sign.   Verbally read [facility]'s contract.  Patient verbally acknowledged that he understood and did not have questions regarding the contract.    Patient is unemployed and has no income. Patient to provide LOS from Mother.   When signed application and contract along with financial information are returned to [facility], then eligibility will be determined.  Sherilyn Dacosta Care Manager [facility]

## 2019-03-17 ENCOUNTER — Telehealth: Payer: Self-pay | Admitting: Pharmacy Technician

## 2019-03-17 NOTE — Telephone Encounter (Signed)
Received 2020 proof of income.  Patient eligible to receive medication assistance at [facility] as long as eligibility requirements continue to be met.  [place] [facility]

## 2019-03-19 ENCOUNTER — Other Ambulatory Visit: Payer: Self-pay

## 2019-05-06 ENCOUNTER — Other Ambulatory Visit: Payer: Self-pay

## 2019-05-06 ENCOUNTER — Ambulatory Visit: Payer: Medicaid Other | Admitting: Pharmacy Technician

## 2019-05-06 DIAGNOSIS — Z79899 Other long term (current) drug therapy: Secondary | ICD-10-CM

## 2019-05-06 NOTE — Progress Notes (Signed)
Spoke with patient and completed financial assistance application for [place] due to recent hospital visit.  Mailing to patient to sign.  Patient agreed to be responsible for gathering financial information and forwarding to appropriate department in [facility].    Sherilyn Dacosta Care Manager [facility]

## 2020-03-12 ENCOUNTER — Ambulatory Visit: Payer: Self-pay | Attending: Internal Medicine

## 2020-03-12 ENCOUNTER — Ambulatory Visit: Payer: Medicaid Other

## 2020-03-12 DIAGNOSIS — Z23 Encounter for immunization: Secondary | ICD-10-CM

## 2020-03-12 NOTE — Progress Notes (Signed)
   Covid-19 Vaccination Clinic  Name:  Stoy Fenn    MRN: [id number] DOB: 10-Dec-1991  03/12/2020  Mr. Clarise Cruz was observed post Covid-19 immunization for 15 minutes without incident. He was provided with Vaccine Information Sheet and instruction to access the V-Safe system.   Mr. Clarise Cruz was instructed to call 911 with any severe reactions post vaccine: Marland Kitchen Difficulty breathing  . Swelling of face and throat  . A fast heartbeat  . A bad rash all over body  . Dizziness and weakness   Immunizations Administered    Name Date Dose VIS Date Route   Pfizer COVID-19 Vaccine 03/12/2020  6:31 PM 0.3 mL 11/28/2019 Intramuscular   Manufacturer: [employer], [employer]   Lot: [id number]   NDC: [address]-2

## 2020-04-02 ENCOUNTER — Ambulatory Visit: Payer: Self-pay | Attending: Internal Medicine

## 2020-04-02 DIAGNOSIS — Z23 Encounter for immunization: Secondary | ICD-10-CM

## 2020-04-02 NOTE — Progress Notes (Signed)
   Covid-19 Vaccination Clinic  Name:  [name]    MRN: [id number] DOB: 11-27-1991  04/02/2020  Mr. [name] was observed post Covid-19 immunization for 15 minutes without incident. He was provided with Vaccine Information Sheet and instruction to access the V-Safe system.   Mr. [name] was instructed to call 911 with any severe reactions post vaccine: Marland Kitchen Difficulty breathing  . Swelling of face and throat  . A fast heartbeat  . A bad rash all over body  . Dizziness and weakness   Immunizations Administered    Name Date Dose VIS Date Route   Pfizer COVID-19 Vaccine 04/02/2020  5:11 PM 0.3 mL 11/28/2019 Intramuscular   Manufacturer: [employer], [employer]   Lot: [id number]   NDC: [address]-4

## 2020-07-12 ENCOUNTER — Telehealth: Payer: Self-pay | Admitting: Pharmacy Technician

## 2020-07-12 NOTE — Telephone Encounter (Signed)
Mother called on Friday, 7/23 and spoke with Marquette Saa and requested that patient's medications be transferred to [facility] because patient has prescription insurance with [facility].  Stanton Kidney giving information to pharmacy team.  Patient notified by letter.  Sherilyn Dacosta Care Manager [facility]   [address] [place], [place]  [address]   July 12, 2020    Mr. Anees V[address] [place], [place]  [address]  Dear [name]:  This is to inform you that you are no longer eligible to receive medication assistance at [facility].  The reason(s) are:    _____Your total gross monthly household income exceeds 250% of the Federal Poverty Level.   _____Tangible assets (savings, checking, stocks/bonds, pension, retirement, etc.) exceeds our limit  _____You are eligible to receive benefits from [facility], [facility] or HIV Medication            Assistance program _____You are eligible to receive benefits from a Medicare Part "D" plan __X__You have prescription insurance  _____You are not an [facility] resident _____Failure to provide all requested proof of income information for 2021.    We regret that we are unable to help you at this time.  If your prescription coverage is terminated, please contact [facility], so that we may reassess your eligibility for our program.  If you have questions, we may be contacted at [phone].  Thank you,  [facility]

## 2021-02-10 ENCOUNTER — Other Ambulatory Visit: Payer: Self-pay

## 2021-02-10 ENCOUNTER — Encounter: Payer: Self-pay | Admitting: Emergency Medicine

## 2021-02-10 ENCOUNTER — Emergency Department: Payer: Medicaid Other

## 2021-02-10 ENCOUNTER — Emergency Department
Admission: EM | Admit: 2021-02-10 | Discharge: 2021-02-10 | Disposition: A | Discharge status: Home / Self Care | Payer: Medicaid Other | Attending: Emergency Medicine | Admitting: Emergency Medicine

## 2021-02-10 DIAGNOSIS — S63690A Other sprain of right index finger, initial encounter: Secondary | ICD-10-CM | POA: Insufficient documentation

## 2021-02-10 DIAGNOSIS — W230XXA Caught, crushed, jammed, or pinched between moving objects, initial encounter: Secondary | ICD-10-CM | POA: Diagnosis not present

## 2021-02-10 DIAGNOSIS — S6991XA Unspecified injury of right wrist, hand and finger(s), initial encounter: Secondary | ICD-10-CM | POA: Diagnosis present

## 2021-02-10 MED ORDER — MELOXICAM 7.5 MG PO TABS
15.0000 mg | ORAL_TABLET | Freq: Once | ORAL | Status: AC
  Administered 2021-02-10: 15 mg via ORAL
  Filled 2021-02-10: qty 2

## 2021-02-10 MED ORDER — MELOXICAM 15 MG PO TABS: 15.0000 mg | ORAL_TABLET | Freq: Every day | ORAL | 0 refills | Status: AC

## 2021-02-10 NOTE — ED Notes (Signed)
Return from Xray. AAOx3.  Skin warm and dry.  NAD 

## 2021-02-10 NOTE — ED Notes (Signed)
See triage note  Presents with injury to right index finger  States he jammed it on the roof of his car 2 days ago    Having increased pain with movement

## 2021-02-10 NOTE — ED Triage Notes (Signed)
Jammed right index finger 2 days ago.  C/O pain.

## 2021-02-12 NOTE — ED Provider Notes (Signed)
[facility] Emergency Department Provider Note  ____________________________________________   Event Date/Time   First MD Initiated Contact with Patient 02/10/21 1804     (approximate)  I have reviewed the triage vital signs and the nursing notes.   HISTORY  Chief Complaint Finger Injury  HPI [name] is a 30 y.o. male who presents to the emergency department for evaluation of pain of the right index finger.  2 days ago, the patient jammed it on the roof of his car and notes the pain has not significantly improved.  Pain is located at the PIP of the right second finger.  Denies history of injury to this before         Past Medical History:  Diagnosis Date  . Seasonal affective disorder [facility])     Patient Active Problem List   Diagnosis Date Noted  . Schizophrenia (HCC) 11/25/2018  . Noncompliance 11/25/2018  . Self-inflicted injury 11/25/2018    History reviewed. No pertinent surgical history.  Prior to Admission medications   Medication Sig Start Date End Date Taking? Authorizing Provider  meloxicam (MOBIC) 15 MG tablet Take 1 tablet (15 mg total) by mouth daily for 15 days. 02/10/21 02/25/21 Yes Lucy Chris, PA  cholecalciferol (VITAMIN D) 25 MCG (1000 UT) tablet Take 1 tablet (1,000 Units total) by mouth daily with lunch. 12/02/18   Hessie Knows, MD  OLANZapine (ZYPREXA) 10 MG tablet Take 1 tablet (10 mg total) by mouth at bedtime. 12/02/18   Hessie Knows, MD  traZODone (DESYREL) 100 MG tablet Take 1 tablet (100 mg total) by mouth at bedtime as needed for sleep. 12/02/18   Hessie Knows, MD    Allergies Haldol [haloperidol lactate] and Other  No family history on file.  Social History Social History   Tobacco Use  . Smoking status: Never Smoker  . Smokeless tobacco: Never Used  Substance Use Topics  . Alcohol use: Never  . Drug use: Never    Review of Systems Constitutional: No fever/chills Eyes: No  visual changes. ENT: No sore throat. Cardiovascular: Denies chest pain. Respiratory: Denies shortness of breath. Gastrointestinal: No abdominal pain.  No nausea, no vomiting.  No diarrhea.  No constipation. Genitourinary: Negative for dysuria. Musculoskeletal:+ Right index finger pain, negative for back pain. Skin: Negative for rash. Neurological: Negative for headaches, focal weakness or numbness.   ____________________________________________   PHYSICAL EXAM:  VITAL SIGNS: ED Triage Vitals  Enc Vitals Group     BP 02/10/21 1739 134/90     Pulse Rate 02/10/21 1739 90     Resp 02/10/21 1739 18     Temp 02/10/21 1739 98.4 F (36.9 C)     Temp Source 02/10/21 1739 Oral     SpO2 02/10/21 1739 98 %     Weight 02/10/21 1706 149 lb 14.6 oz (68 kg)     Height 02/10/21 1706 5\' 11"  (1.803 m)     Head Circumference --      Peak Flow --      Pain Score 02/10/21 1706 4     Pain Loc --      Pain Edu? --      Excl. in GC? --    Constitutional: Alert and oriented. Well appearing and in no acute distress. Eyes: Conjunctivae are normal. PERRL. EOMI. Head: Atraumatic. Mouth/Throat: Mucous membranes are moist.   Neck: No stridor.   Musculoskeletal: There is minimal swelling noted to the PIP of the right second digit.  There is  tenderness noted to the joint line as well, no tenderness of the DIP or MCP.  Patient is able to move through full range of motion without difficulty.  Radial pulse 2+, capillary refill less than 3 seconds all digits. Neurologic:  Normal speech and language. No gross focal neurologic deficits are appreciated.  Skin:  Skin is warm, dry and intact. No rash noted. Psychiatric: Mood and affect are normal. Speech and behavior are normal.  ____________________________________________  RADIOLOGY I, Lucy Chris, personally viewed and evaluated these images (plain radiographs) as part of my medical decision making, as well as reviewing the written report by the  radiologist.  ED provider interpretation: X-rays do not demonstrate any acute fracture  ____________________________________________   INITIAL IMPRESSION / ASSESSMENT AND PLAN / ED COURSE  As part of my medical decision making, I reviewed the following data within the electronic MEDICAL RECORD NUMBER Nursing notes reviewed and incorporated, Radiograph reviewed and Notes from prior ED visits        Patient is a 29 year old male who presents to the emergency department for evaluation of right index finger pain, see HPI for further details.  In triage, the patient has normal vital signs.  On physical exam there is mild tenderness noted to the PIP as well as some surrounding soft tissue swelling.  Patient has full active range of motion of all digits of the hand.  X-rays are negative for fracture.  Suspect soft tissue/ligament sprain given that the patient is able to still move it through full range of motion on his own.  Recommended splint versus buddy tape and patient would like to proceed with splint.  Advised anti-inflammatory, which the patient is amenable with.  Recommended follow-up with orthopedics or return for any acute worsening.  Patient stable this time for outpatient follow-up.      ____________________________________________   FINAL CLINICAL IMPRESSION(S) / ED DIAGNOSES  Final diagnoses:  Other sprain of right index finger, initial encounter     ED Discharge Orders         Ordered    meloxicam (MOBIC) 15 MG tablet  Daily        02/10/21 1904          *Please note:  [name] was evaluated in Emergency Department on 02/12/2021 for the symptoms described in the history of present illness. He was evaluated in the context of the global COVID-19 pandemic, which necessitated consideration that the patient might be at risk for infection with the SARS-CoV-2 virus that causes COVID-19. Institutional protocols and algorithms that pertain to the evaluation of patients at  risk for COVID-19 are in a state of rapid change based on information released by regulatory bodies including the CDC and federal and state organizations. These policies and algorithms were followed during the patient's care in the ED.  Some ED evaluations and interventions may be delayed as a result of limited staffing during and the pandemic.*   Note:  This document was prepared using Dragon voice recognition software and may include unintentional dictation errors.   Lucy Chris, PA 02/12/21 1933    Phineas Semen, MD 02/12/21 [phone]

## 2021-03-07 ENCOUNTER — Other Ambulatory Visit: Payer: Self-pay

## 2021-03-07 ENCOUNTER — Emergency Department
Admission: EM | Admit: 2021-03-07 | Discharge: 2021-03-07 | Discharge status: Absent without leave | Payer: Medicaid Other

## 2021-03-07 ENCOUNTER — Emergency Department
Admission: EM | Admit: 2021-03-07 | Discharge: 2021-03-07 | Disposition: A | Discharge status: Home / Self Care | Payer: Medicaid Other | Attending: Student in an Organized Health Care Education/Training Program | Admitting: Student in an Organized Health Care Education/Training Program

## 2021-03-07 DIAGNOSIS — W230XXD Caught, crushed, jammed, or pinched between moving objects, subsequent encounter: Secondary | ICD-10-CM | POA: Diagnosis not present

## 2021-03-07 DIAGNOSIS — S6991XD Unspecified injury of right wrist, hand and finger(s), subsequent encounter: Secondary | ICD-10-CM | POA: Diagnosis present

## 2021-03-07 DIAGNOSIS — S63630D Sprain of interphalangeal joint of right index finger, subsequent encounter: Secondary | ICD-10-CM | POA: Diagnosis not present

## 2021-03-07 NOTE — ED Provider Notes (Signed)
[facility] Emergency Department Provider Note  ____________________________________________   Event Date/Time   First MD Initiated Contact with Patient 03/07/21 1350     (approximate)  I have reviewed the triage vital signs and the nursing notes.   HISTORY  Chief Complaint Finger Injury    HPI [name] is a 30 y.o. male [place] emergency department complaining of continued right index finger pain after jamming it 3 weeks ago.  States he had x-ray here was negative.  States it does not hurt all the time only when he uses his finger a lot at work.  Patient has not followed up with orthopedics or his regular doctor.  He is taking the medication still.    Past Medical History:  Diagnosis Date  . Seasonal affective disorder [facility])     Patient Active Problem List   Diagnosis Date Noted  . Schizophrenia (HCC) 11/25/2018  . Noncompliance 11/25/2018  . Self-inflicted injury 11/25/2018    History reviewed. No pertinent surgical history.  Prior to Admission medications   Medication Sig Start Date End Date Taking? Authorizing Provider  cholecalciferol (VITAMIN D) 25 MCG (1000 UT) tablet Take 1 tablet (1,000 Units total) by mouth daily with lunch. 12/02/18   Hessie Knows, MD  OLANZapine (ZYPREXA) 10 MG tablet Take 1 tablet (10 mg total) by mouth at bedtime. 12/02/18   Hessie Knows, MD  traZODone (DESYREL) 100 MG tablet Take 1 tablet (100 mg total) by mouth at bedtime as needed for sleep. 12/02/18   Hessie Knows, MD    Allergies Haldol [haloperidol lactate] and Other  No family history on file.  Social History Social History   Tobacco Use  . Smoking status: Never Smoker  . Smokeless tobacco: Never Used  Substance Use Topics  . Alcohol use: Never  . Drug use: Never    Review of Systems  Constitutional: No fever/chills Eyes: No visual changes. ENT: No sore throat. Respiratory: Denies cough Genitourinary: Negative for  dysuria. Musculoskeletal: Negative for back pain.  Positive right index finger pain Skin: Negative for rash. Psychiatric: no mood changes,     ____________________________________________   PHYSICAL EXAM:  VITAL SIGNS: ED Triage Vitals  Enc Vitals Group     BP 03/07/21 1230 (!) 159/91     Pulse Rate 03/07/21 1230 74     Resp 03/07/21 1230 17     Temp 03/07/21 1230 99.1 F (37.3 C)     Temp Source 03/07/21 1230 Oral     SpO2 03/07/21 1230 98 %     Weight 03/07/21 1234 170 lb (77.1 kg)     Height 03/07/21 1234 5\' 11"  (1.803 m)     Head Circumference --      Peak Flow --      Pain Score 03/07/21 1234 6     Pain Loc --      Pain Edu? --      Excl. in GC? --     Constitutional: Alert and oriented. Well appearing and in no acute distress. Eyes: Conjunctivae are normal.  Head: Atraumatic. Nose: No congestion/rhinnorhea. Mouth/Throat: Mucous membranes are moist.   Neck:  supple no lymphadenopathy noted Cardiovascular: Normal rate, regular rhythm.  Respiratory: Normal respiratory effort.  No retractions, GU: deferred Musculoskeletal: FROM all extremities, warm and well perfused, right index finger is noted any tenderness, full range of motion noted, neurovascular is intact Neurologic:  Normal speech and language.  Skin:  Skin is warm, dry and intact. No rash noted. Psychiatric:  Mood and affect are normal. Speech and behavior are normal.  ____________________________________________   LABS (all labs ordered are listed, but only abnormal results are displayed)  Labs Reviewed - No data to display ____________________________________________   ____________________________________________  RADIOLOGY    ____________________________________________   PROCEDURES  Procedure(s) performed: No  Procedures    ____________________________________________   INITIAL IMPRESSION / ASSESSMENT AND PLAN / ED COURSE  Pertinent labs & imaging results that were available  during my care of the patient were reviewed by me and considered in my medical decision making (see chart for details).   Patient is a 30 year old male presents with continued right index finger pain see HPI.  Physical exam is unremarkable.  Explained to the patient that he should buddy tape his fingers for extra support.  Follow-up with the hand specialist at [facility].  Take over-the-counter ibuprofen.  Apply ice.  Return if worsening.  Discharged stable condition.     [name] was evaluated in Emergency Department on 03/07/2021 for the symptoms described in the history of present illness. He was evaluated in the context of the global COVID-19 pandemic, which necessitated consideration that the patient might be at risk for infection with the SARS-CoV-2 virus that causes COVID-19. Institutional protocols and algorithms that pertain to the evaluation of patients at risk for COVID-19 are in a state of rapid change based on information released by regulatory bodies including the CDC and federal and state organizations. These policies and algorithms were followed during the patient's care in the ED.    As part of my medical decision making, I reviewed the following data within the electronic MEDICAL RECORD NUMBER Nursing notes reviewed and incorporated, Old chart reviewed, Notes from prior ED visits and [place] Controlled Substance Database  ____________________________________________   FINAL CLINICAL IMPRESSION(S) / ED DIAGNOSES  Final diagnoses:  Sprain of interphalangeal joint of right index finger, subsequent encounter      NEW MEDICATIONS STARTED DURING THIS VISIT:  Discharge Medication List as of 03/07/2021  2:21 PM       Note:  This document was prepared using Dragon voice recognition software and may include unintentional dictation errors.    Faythe Ghee, PA-C 03/07/21 1629    Willy Eddy, MD 03/18/21 1537

## 2021-03-07 NOTE — Discharge Instructions (Addendum)
Buddy tape your fingers for extra support Return to the ER if worsening Follow up with [facility], ask for the hand surgeon

## 2021-03-07 NOTE — ED Triage Notes (Signed)
Pt states he was seen here around 2 weeks ago for a finger injury and had negative xray, states he is still having pain with decreased ROM

## 2021-03-07 NOTE — ED Notes (Signed)
See triage note  Presents with pain to right index finger  States he jammed his finger about 3 weeks ago  Has been seen but conts' to have pain  Denies any new injury

## 2021-07-14 DIAGNOSIS — W57XXXA Bitten or stung by nonvenomous insect and other nonvenomous arthropods, initial encounter: Secondary | ICD-10-CM | POA: Diagnosis not present

## 2021-07-14 DIAGNOSIS — S0592XA Unspecified injury of left eye and orbit, initial encounter: Secondary | ICD-10-CM | POA: Diagnosis present

## 2021-07-14 DIAGNOSIS — S00262A Insect bite (nonvenomous) of left eyelid and periocular area, initial encounter: Secondary | ICD-10-CM | POA: Diagnosis not present

## 2021-07-14 NOTE — ED Triage Notes (Signed)
Patient reports getting stung by bee on left eye. Mild swelling noted. Denies hx of allergy to bees. Denies taking any medication prior to arrival. Patient A&OX3.

## 2021-07-14 NOTE — ED Notes (Signed)
FIRST NURSE NOTE: Pt c/o being stung by bee to L eye, denies any allergies to bees. No distress noted at this time.

## 2021-07-15 ENCOUNTER — Emergency Department
Admission: EM | Admit: 2021-07-15 | Discharge: 2021-07-15 | Disposition: A | Discharge status: Home / Self Care | Payer: Medicaid Other | Attending: Emergency Medicine | Admitting: Emergency Medicine

## 2021-07-15 DIAGNOSIS — T63481A Toxic effect of venom of other arthropod, accidental (unintentional), initial encounter: Secondary | ICD-10-CM

## 2021-07-15 MED ORDER — IBUPROFEN 600 MG PO TABS
600.0000 mg | ORAL_TABLET | Freq: Once | ORAL | Status: AC
  Administered 2021-07-15: 600 mg via ORAL
  Filled 2021-07-15: qty 1

## 2021-07-15 MED ORDER — LORATADINE 10 MG PO TABS
10.0000 mg | ORAL_TABLET | Freq: Once | ORAL | Status: AC
  Administered 2021-07-15: 10 mg via ORAL
  Filled 2021-07-15: qty 1

## 2021-07-15 MED ORDER — DEXAMETHASONE 10 MG/ML FOR PEDIATRIC ORAL USE
10.0000 mg | Freq: Once | INTRAMUSCULAR | Status: AC
  Administered 2021-07-15: 10 mg via ORAL
  Filled 2021-07-15: qty 1

## 2021-07-15 NOTE — ED Provider Notes (Signed)
[facility] Emergency Department Provider Note  ____________________________________________   Event Date/Time   First MD Initiated Contact with Patient 07/15/21 0142     (approximate)  I have reviewed the triage vital signs and the nursing notes.   HISTORY  Chief Complaint Insect Bite    HPI [name] is a 30 y.o. male  with no contributory past medical history who presents for evaluation of an insect sting to his left eyelid.  He had acute onset of severe swelling but the swelling has gotten better by now.  No visual changes although it was hard to see initially when he was swollen.  He has not ever been stung by anything so he does not know if he has an allergy.  The pain was sharp and severe but now it is gotten better.  He did not take anything at home.  No other injuries or issues.  No difficulty breathing.        Past Medical History:  Diagnosis Date   Seasonal affective disorder [facility])     Patient Active Problem List   Diagnosis Date Noted   Schizophrenia (HCC) 11/25/2018   Noncompliance 11/25/2018   Self-inflicted injury 11/25/2018    No past surgical history on file.  Prior to Admission medications   Medication Sig Start Date End Date Taking? Authorizing Provider  cholecalciferol (VITAMIN D) 25 MCG (1000 UT) tablet Take 1 tablet (1,000 Units total) by mouth daily with lunch. 12/02/18   Hessie Knows, MD  OLANZapine (ZYPREXA) 10 MG tablet Take 1 tablet (10 mg total) by mouth at bedtime. 12/02/18   Hessie Knows, MD  traZODone (DESYREL) 100 MG tablet Take 1 tablet (100 mg total) by mouth at bedtime as needed for sleep. 12/02/18   Hessie Knows, MD    Allergies Haldol [haloperidol lactate] and Other  No family history on file.  Social History Social History   Tobacco Use   Smoking status: Never   Smokeless tobacco: Never  Substance Use Topics   Alcohol use: Never   Drug use: Never    Review of  Systems Constitutional: No fever/chills Eyes: Insect sting to left upper eyelid. ENT: No sore throat. Respiratory: Denies shortness of breath. Gastrointestinal: No nausea, no vomiting.   Musculoskeletal: Negative for neck pain.  Negative for back pain. Integumentary: Negative for rash. Neurological: Negative for headaches, focal weakness or numbness.   ____________________________________________   PHYSICAL EXAM:  VITAL SIGNS: ED Triage Vitals [07/14/21 2042]  Enc Vitals Group     BP (!) 165/106     Pulse Rate 91     Resp 18     Temp 98.4 F (36.9 C)     Temp Source Oral     SpO2 100 %     Weight 77 kg (169 lb 12.1 oz)     Height 1.803 m (5\' 11" )     Head Circumference      Peak Flow      Pain Score 2     Pain Loc      Pain Edu?      Excl. in GC?     Constitutional: Alert and oriented.  Eyes: Conjunctivae are normal.  There is some very mild erythema and swelling to the left upper eyelid with no visible puncture wound, stinger, or evidence of infection.  Pupils are equal and reactive. Head: Atraumatic. Nose: No congestion/rhinnorhea. Mouth/Throat: Patient is wearing a mask. Neck: No stridor.  No meningeal signs.   Cardiovascular: Normal rate,  regular rhythm. Good peripheral circulation. Respiratory: Normal respiratory effort.  No retractions. Neurologic:  Normal speech and language. No gross focal neurologic deficits are appreciated.  Skin:  Skin is warm, dry and intact. Psychiatric: Mood and affect are normal. Speech and behavior are normal.  ____________________________________________   INITIAL IMPRESSION / MDM / ASSESSMENT AND PLAN / ED COURSE  As part of my medical decision making, I reviewed the following data within the electronic MEDICAL RECORD NUMBER Nursing notes reviewed and incorporated and Old chart reviewed   Minor insect sting or bite to left upper eyelid.  Very minimal swelling.  For his comfort I gave a dose of Decadron 10 mg p.o. which should help  with the swelling and inflammation.  I give a dose of ibuprofen 600 mg and loratadine 10 mg.  I gave my usual and customary management recommendations and return precautions.  He understands and agrees.          ____________________________________________  FINAL CLINICAL IMPRESSION(S) / ED DIAGNOSES  Final diagnoses:  Insect stings, accidental or unintentional, initial encounter     MEDICATIONS GIVEN DURING THIS VISIT:  Medications  dexamethasone (DECADRON) 10 MG/ML injection for Pediatric ORAL use 10 mg (10 mg Oral Given 07/15/21 0230)  loratadine (CLARITIN) tablet 10 mg (10 mg Oral Given 07/15/21 0230)  ibuprofen (ADVIL) tablet 600 mg (600 mg Oral Given 07/15/21 0230)     ED Discharge Orders     None        Note:  This document was prepared using Dragon voice recognition software and may include unintentional dictation errors.   Loleta Rose, MD 07/15/21 [phone]

## 2021-07-15 NOTE — ED Notes (Signed)
ED Provider at bedside. 

## 2021-07-15 NOTE — Discharge Instructions (Signed)
Please take an over-the-counter allergy pill such as Claritin (loratadine) or Zyrtec (cetirizine) daily for the next couple of days.  You can take ibuprofen 600 mg 3 times a day with meals, as well as Tylenol according to label instructions if you need additional pain relief.  You can place cold compresses over your left eye as needed for pain and swelling as well.

## 2021-11-20 IMAGING — CR DG FINGER INDEX 2+V*R*
1 series · 3 of 3 positions shown · non-contrast
Comparison: None.

CLINICAL DATA: Pain and swelling after trauma 2 days ago.

EXAM:
RIGHT INDEX FINGER 2+V

[Series 1: dg finger index right · 0.14mm/px · 3 of 3 slices shown]
[im 1/3]
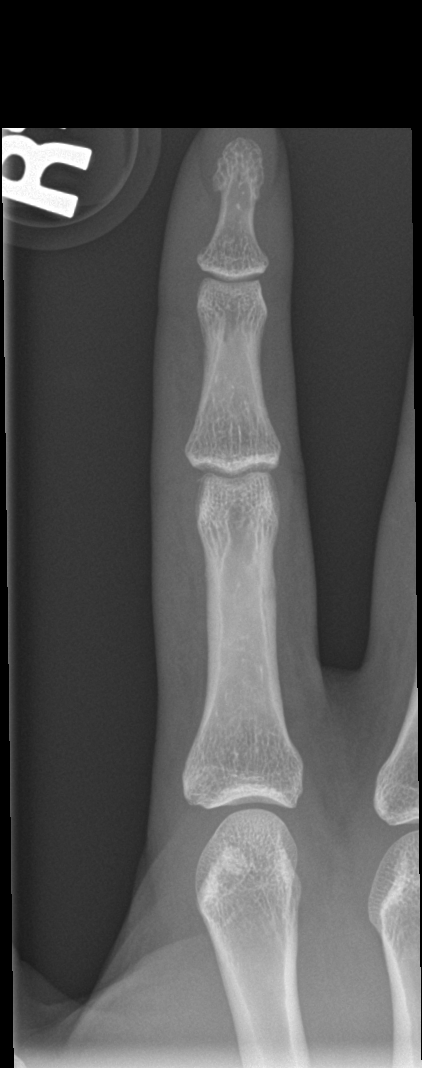
[im 2/3]
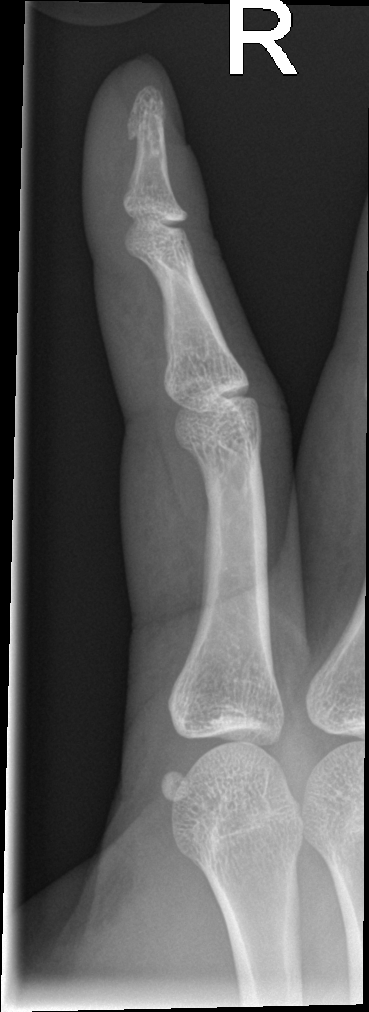
[im 3/3]
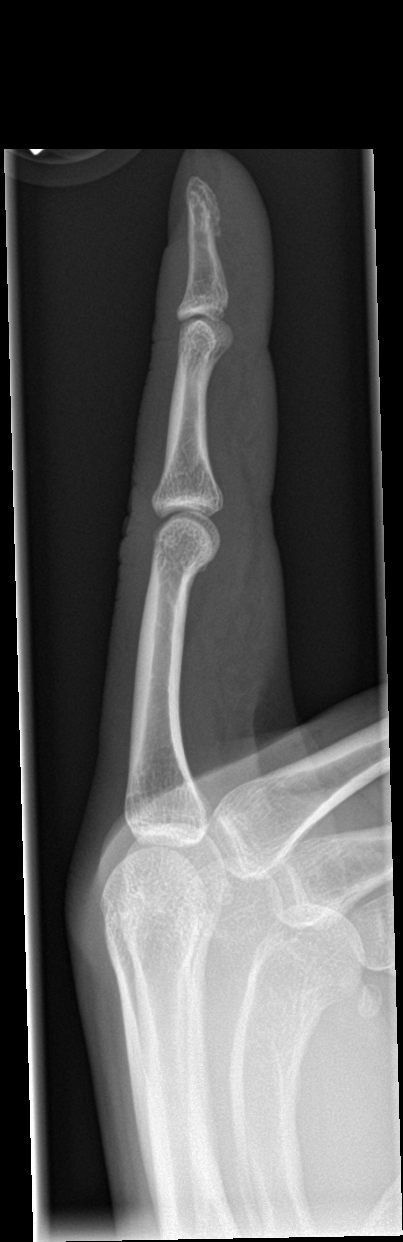

[3 of 3 positions shown; findings below may reference images not displayed]

FINDINGS: No acute fracture or dislocation.  No definite soft tissue swelling.
IMPRESSION: No acute osseous abnormality.

## 2022-03-27 ENCOUNTER — Other Ambulatory Visit: Payer: Self-pay

## 2022-03-27 ENCOUNTER — Emergency Department
Admission: EM | Admit: 2022-03-27 | Discharge: 2022-03-27 | Disposition: A | Discharge status: Home / Self Care | Payer: Medicaid Other | Attending: Emergency Medicine | Admitting: Emergency Medicine

## 2022-03-27 ENCOUNTER — Emergency Department: Payer: Medicaid Other

## 2022-03-27 ENCOUNTER — Encounter: Payer: Self-pay | Admitting: Emergency Medicine

## 2022-03-27 DIAGNOSIS — Y990 Civilian activity done for income or pay: Secondary | ICD-10-CM | POA: Insufficient documentation

## 2022-03-27 DIAGNOSIS — S6992XA Unspecified injury of left wrist, hand and finger(s), initial encounter: Secondary | ICD-10-CM | POA: Diagnosis present

## 2022-03-27 DIAGNOSIS — W230XXA Caught, crushed, jammed, or pinched between moving objects, initial encounter: Secondary | ICD-10-CM | POA: Diagnosis not present

## 2022-03-27 MED ORDER — IBUPROFEN 600 MG PO TABS
600.0000 mg | ORAL_TABLET | Freq: Once | ORAL | Status: AC
Start: 2022-03-27 — End: 2022-03-27
  Administered 2022-03-27: 600 mg via ORAL
  Filled 2022-03-27: qty 1

## 2022-03-27 NOTE — ED Provider Notes (Signed)
? ?[facility] ?Provider Note ? ? ? Event Date/Time  ? First MD Initiated Contact with Patient 03/27/22 [phone]   ?  (approximate) ? ? ?History  ? ?Finger Injury ? ? ?HPI ? ?[name] is a 31 y.o. male who presents to the ED for evaluation of Finger Injury ?  ?I reviewed PCP visit from November.  Annual physical.  Bipolar disorder. ? ?Patient presents to the ED for evaluation of possible jamming his finger at work.  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  He questions if he can even work anymore because he has long fingers and sometimes jams it.  He reports using half tablet of ibuprofen at home with some improvement of his pain. ? ?Physical Exam  ? ?Triage Vital Signs: ?ED Triage Vitals  ?Enc Vitals Group  ?   BP 03/27/22 0332 (!) 151/91  ?   Pulse Rate 03/27/22 0332 71  ?   Resp 03/27/22 0332 16  ?   Temp 03/27/22 0332 98 ?F (36.7 ?C)  ?   Temp Source 03/27/22 0332 Oral  ?   SpO2 03/27/22 0332 98 %  ?   Weight 03/27/22 0329 195 lb (88.5 kg)  ?   Height 03/27/22 0329 5\' 11"  (1.803 m)  ?   Head Circumference --   ?   Peak Flow --   ?   Pain Score 03/27/22 0328 8  ?   Pain Loc --   ?   Pain Edu? --   ?   Excl. in GC? --   ? ? ?Most recent vital signs: ?Vitals:  ? 03/27/22 0332  ?BP: (!) 151/91  ?Pulse: 71  ?Resp: 16  ?Temp: 98 ?F (36.7 ?C)  ?SpO2: 98%  ? ? ?General: Awake, no distress.  ?CV:  Good peripheral perfusion.  ?Resp:  Normal effort.  ?Abd:  No distention.  ?MSK:  No deformity noted.  Full active and passive range of motion of all fingers to bilateral hands.  To the affected finger, left third finger, I see no signs of trauma, swelling, no subungual hematoma.  Nontender to palpation throughout and has full range of motion.  I see no evidence of any pathology. ?Neuro:  No focal deficits appreciated. ?Other:   ? ? ?ED Results / Procedures / Treatments  ? ?Labs ?(all labs  ordered are listed, but only abnormal results are displayed) ?Labs Reviewed - No data to display ? ?EKG ? ? ?RADIOLOGY ?Plain film of the affected finger without evidence of fracture or dislocation. ? ?Official radiology report(s): ?No results found. ? ?PROCEDURES and INTERVENTIONS: ? ?Procedures ? ?Medications  ?ibuprofen (ADVIL) tablet 600 mg (600 mg Oral Given 03/27/22 0351)  ? ? ? ?IMPRESSION / MDM / ASSESSMENT AND PLAN / ED COURSE  ?I reviewed the triage vital signs and the nursing notes. ? ?45-year-old male presents to the ED for the ration of a possible finger injury, without evidence of any acute pathology and suitable for outpatient management.  He looks well overall.  No signs of actual trauma, tendinous disruption, subungual hematoma, fracture or dislocation.  We will give some ibuprofen at his request and discharge with return precautions. ? ?  ? ? ?FINAL CLINICAL IMPRESSION(S) / ED DIAGNOSES  ? ?Final diagnoses:  ?Jammed finger (interphalangeal joint), left, initial encounter  ? ? ? ?Rx / DC Orders  ? ?  ED Discharge Orders   ? ? None  ? ?  ? ? ? ?Note:  This document was prepared using Dragon voice recognition software and may include unintentional dictation errors. ?  ?Delton Prairie, MD ?03/27/22 [phone] ? ?

## 2022-03-27 NOTE — Discharge Instructions (Signed)
Please take Tylenol and ibuprofen/Advil for your pain.  It is safe to take them together, or to alternate them every few hours.  Take up to 1000mg of Tylenol at a time, up to 4 times per day.  Do not take more than 4000 mg of Tylenol in 24 hours.  For ibuprofen, take 400-600 mg, 4-5 times per day. ° ° °

## 2022-03-27 NOTE — ED Notes (Signed)
Pt given ice pack XRAY in room ?

## 2022-03-27 NOTE — ED Triage Notes (Signed)
Pt arrived via POV with c/o L middle finger pain, states he feels like it is jammed, concerned about returning to work due to the pain.  ?

## 2022-05-04 ENCOUNTER — Emergency Department
Admission: EM | Admit: 2022-05-04 | Discharge: 2022-05-04 | Disposition: A | Discharge status: Home / Self Care | Payer: Medicaid Other | Attending: Emergency Medicine | Admitting: Emergency Medicine

## 2022-05-04 ENCOUNTER — Other Ambulatory Visit: Payer: Self-pay

## 2022-05-04 ENCOUNTER — Encounter: Payer: Self-pay | Admitting: Emergency Medicine

## 2022-05-04 DIAGNOSIS — Y92009 Unspecified place in unspecified non-institutional (private) residence as the place of occurrence of the external cause: Secondary | ICD-10-CM | POA: Diagnosis not present

## 2022-05-04 DIAGNOSIS — S60561A Insect bite (nonvenomous) of right hand, initial encounter: Secondary | ICD-10-CM | POA: Insufficient documentation

## 2022-05-04 DIAGNOSIS — Y9389 Activity, other specified: Secondary | ICD-10-CM | POA: Insufficient documentation

## 2022-05-04 DIAGNOSIS — W57XXXA Bitten or stung by nonvenomous insect and other nonvenomous arthropods, initial encounter: Secondary | ICD-10-CM | POA: Diagnosis not present

## 2022-05-04 MED ORDER — FAMOTIDINE 40 MG PO TABS: 40.0000 mg | ORAL_TABLET | Freq: Every day | ORAL | 0 refills | Status: AC

## 2022-05-04 MED ORDER — DIPHENHYDRAMINE HCL 25 MG PO CAPS
25.0000 mg | ORAL_CAPSULE | Freq: Three times a day (TID) | ORAL | 0 refills | Status: AC | PRN
Start: 2022-05-04 — End: 2022-05-07

## 2022-05-04 MED ORDER — DEXAMETHASONE 10 MG/ML FOR PEDIATRIC ORAL USE
10.0000 mg | Freq: Once | INTRAMUSCULAR | Status: AC
  Administered 2022-05-04: 10 mg via ORAL
  Filled 2022-05-04: qty 1

## 2022-05-04 NOTE — ED Notes (Signed)
Pt A&O, pt given discharge instructions, pt able to ambulate with steady gait. ?

## 2022-05-04 NOTE — ED Provider Notes (Signed)
[facility] Provider Note  Patient Contact: 9:09 PM (approximate)   History   Hand Pain   HPI  [name] is a 31 y.o. male presents to the emergency department with some localized erythema along the dorsal aspect of the right hand after patient was stung by honeybee yesterday.  Patient denies fever or chills but states that he was concerned about hand swelling.  He has not tried any medications at home.  No prior history of anaphylaxis.      Physical Exam   Triage Vital Signs: ED Triage Vitals  Enc Vitals Group     BP 05/04/22 1955 139/88     Pulse Rate 05/04/22 1955 95     Resp 05/04/22 1955 14     Temp 05/04/22 1955 98.7 F (37.1 C)     Temp Source 05/04/22 1955 Oral     SpO2 05/04/22 1955 95 %     Weight 05/04/22 1954 190 lb (86.2 kg)     Height 05/04/22 1954 5\' 10"  (1.778 m)     Head Circumference --      Peak Flow --      Pain Score 05/04/22 1953 7     Pain Loc --      Pain Edu? --      Excl. in [place]? --     Most recent vital signs: Vitals:   05/04/22 1955  BP: 139/88  Pulse: 95  Resp: 14  Temp: 98.7 F (37.1 C)  SpO2: 95%     General: Alert and in no acute distress. Eyes:  PERRL. EOMI. Head: No acute traumatic findings ENT:      Nose: No congestion/rhinnorhea.      Mouth/Throat: Mucous membranes are moist. Neck: No stridor. No cervical spine tenderness to palpation. Cardiovascular:  Good peripheral perfusion Respiratory: Normal respiratory effort without tachypnea or retractions. Lungs CTAB. Good air entry to the bases with no decreased or absent breath sounds. Gastrointestinal: Bowel sounds 4 quadrants. Soft and nontender to palpation. No guarding or rigidity. No palpable masses. No distention. No CVA tenderness. Musculoskeletal: Patient has erythema overlying the dorsum of the right hand Neurologic:  No gross focal neurologic deficits are appreciated.  Skin:   No rash noted Other:   ED Results /  Procedures / Treatments   Labs (all labs ordered are listed, but only abnormal results are displayed) Labs Reviewed - No data to display       PROCEDURES:  Critical Care performed: No  Procedures   MEDICATIONS ORDERED IN ED: Medications  dexamethasone (DECADRON) 10 MG/ML injection for Pediatric ORAL use 10 mg (has no administration in time range)     IMPRESSION / MDM / ASSESSMENT AND PLAN / ED COURSE  I reviewed the triage vital signs and the nursing notes.                              Assessment and plan Insect bite/sting 31 year old male presents to the emergency department with pain and swelling along the dorsal aspect of his right hand after being stung by honeybee.  Will give 1 dose of Decadron in the emergency department to help with swelling and will recommend Benadryl every 8 hours until symptoms resolve along with daily Pepcid.        FINAL CLINICAL IMPRESSION(S) / ED DIAGNOSES   Final diagnoses:  Insect bite of right hand, initial encounter     Rx / DC  Orders   ED Discharge Orders          Ordered    diphenhydrAMINE (BENADRYL ALLERGY) 25 mg capsule  Every 8 hours PRN        05/04/22 2108    famotidine (PEPCID) 40 MG tablet  Daily        05/04/22 2108             Note:  This document was prepared using Dragon voice recognition software and may include unintentional dictation errors.   Vallarie Mare [place], Hershal Coria 05/04/22 2111    Vanessa [place], MD 05/05/22 1141

## 2022-05-04 NOTE — Discharge Instructions (Addendum)
You can take Benadryl every eight hours as needed for itching.  You cant take 40 mg of Pepcid once daily.

## 2022-05-04 NOTE — ED Triage Notes (Signed)
Pt to ED from home c/o being stung by a honey bee yesterday evening.  States was working outside at home and this morning had some itching, swelling and pain this morning to right hand.  Denies allergies to bees.  Patient has good strong movement to hand and fingers, skin warm and dry, chest rise even and unlabored, in NAD at this time.

## 2023-02-12 ENCOUNTER — Emergency Department
Admission: EM | Admit: 2023-02-12 | Discharge: 2023-02-12 | Disposition: A | Discharge status: Home / Self Care | Payer: Medicaid Other | Attending: Emergency Medicine | Admitting: Emergency Medicine

## 2023-02-12 ENCOUNTER — Encounter: Payer: Self-pay | Admitting: Emergency Medicine

## 2023-02-12 ENCOUNTER — Other Ambulatory Visit: Payer: Self-pay

## 2023-02-12 DIAGNOSIS — J020 Streptococcal pharyngitis: Secondary | ICD-10-CM | POA: Diagnosis not present

## 2023-02-12 DIAGNOSIS — J069 Acute upper respiratory infection, unspecified: Secondary | ICD-10-CM | POA: Insufficient documentation

## 2023-02-12 DIAGNOSIS — Z1152 Encounter for screening for COVID-19: Secondary | ICD-10-CM | POA: Diagnosis not present

## 2023-02-12 DIAGNOSIS — R059 Cough, unspecified: Secondary | ICD-10-CM | POA: Diagnosis present

## 2023-02-12 LAB — RESP PANEL BY RT-PCR (RSV, FLU A&B, COVID)  RVPGX2
Influenza A by PCR: NEGATIVE
Influenza B by PCR: NEGATIVE
Resp Syncytial Virus by PCR: NEGATIVE
SARS Coronavirus 2 by RT PCR: NEGATIVE

## 2023-02-12 LAB — GROUP A STREP BY PCR: Group A Strep by PCR: DETECTED — AB

## 2023-02-12 MED ORDER — AMOXICILLIN 875 MG PO TABS: 875.0000 mg | ORAL_TABLET | Freq: Two times a day (BID) | ORAL | 0 refills | Status: AC

## 2023-02-12 NOTE — ED Triage Notes (Signed)
Pt reports reoccurring sore throat, cough, congestion for month. Pt requesting flu or covid shots.

## 2023-02-12 NOTE — ED Provider Notes (Signed)
[facility] Provider Note    Event Date/Time   First MD Initiated Contact with Patient 02/12/23 1453     (approximate)   History   Nasal Congestion and Cough   HPI  [name] is a 32 y.o. male with history of schizophrenia presents emergency department complaint of cough, congestion, sore throat for 2 days.  States started having a fever 2 days ago also.  Would also like STI testing.  But is having no symptoms of STIs.  He denies any vomiting or diarrhea      Physical Exam   Triage Vital Signs: ED Triage Vitals  Enc Vitals Group     BP 02/12/23 1410 (!) 142/94     Pulse Rate 02/12/23 1410 (!) 101     Resp 02/12/23 1410 16     Temp 02/12/23 1410 97.7 F (36.5 C)     Temp src --      SpO2 02/12/23 1410 95 %     Weight 02/12/23 1414 190 lb (86.2 kg)     Height 02/12/23 1414 '5\' 11"'$  (1.803 m)     Head Circumference --      Peak Flow --      Pain Score 02/12/23 1411 0     Pain Loc --      Pain Edu? --      Excl. in [place]? --     Most recent vital signs: Vitals:   02/12/23 1410  BP: (!) 142/94  Pulse: (!) 101  Resp: 16  Temp: 97.7 F (36.5 C)  SpO2: 95%     General: Awake, no distress.   CV:  Good peripheral perfusion. regular rate and  rhythm Resp:  Normal effort. Lungs cta Abd:  No distention.   Other:      ED Results / Procedures / Treatments   Labs (all labs ordered are listed, but only abnormal results are displayed) Labs Reviewed  GROUP A STREP BY PCR - Abnormal; Notable for the following components:      Result Value   Group A Strep by PCR DETECTED (*)    All other components within normal limits  RESP PANEL BY RT-PCR (RSV, FLU A&B, COVID)  [id number]     EKG     RADIOLOGY     PROCEDURES:   Procedures   MEDICATIONS ORDERED IN ED: Medications - No data to display   IMPRESSION / MDM / [place] / ED COURSE  I reviewed the triage vital signs and the nursing notes.                               Differential diagnosis includes, but is not limited to, COVID, influenza, RSV, strep, viral URI  Patient's presentation is most consistent with acute complicated illness / injury requiring diagnostic workup.   Respiratory panel obtained, strep test obtained  I did explain to the patient that we do not do routine STI testing.  He can follow-up with health department or his regular doctor.  Will call him with test results.  Patient is taken to leave a message.  If strep is positive we will give him a prescription for amoxicillin.  Patient was discharged stable condition.  In agreement treatment plan at this time   Strep test was positive, respiratory panel reassuring  I did explain the findings to the patient via message.  I explained to him that I would send a  prescription for amoxicillin to the [facility] on [address].  He states antibiotic for strep throat.  Follow-up with his regular doctor as needed.   FINAL CLINICAL IMPRESSION(S) / ED DIAGNOSES   Final diagnoses:  Acute URI  Acute streptococcal pharyngitis     Rx / DC Orders   ED Discharge Orders          Ordered    amoxicillin (AMOXIL) 875 MG tablet  2 times daily        02/12/23 1656             Note:  This document was prepared using Dragon voice recognition software and may include unintentional dictation errors.    Versie Starks, PA-C 02/12/23 1658    Lavonia Drafts, MD 02/13/23 [phone]

## 2023-02-12 NOTE — ED Notes (Signed)
Pt states sore throat, congestion, runny nose

## 2023-02-12 NOTE — Discharge Instructions (Signed)
Follow-up with your regular doctor for your routine STI testing. Your COVID and strep test were results and will be on [name] MyChart vital can also call you and leave a message.  If you are positive for COVID you will need to quarantine for 5 days.  If your strep test is positive I will call in an antibiotic for you.
